# Patient Record
Sex: Male | Born: 1985 | Race: Black or African American | Hispanic: No | Marital: Married | Smoking: Never smoker
Health system: Southern US, Community
[De-identification: ages and names within clinical notes are randomized; demographics above are authoritative.]

## PROBLEM LIST (undated history)

## (undated) DIAGNOSIS — I629 Nontraumatic intracranial hemorrhage, unspecified: Secondary | ICD-10-CM

## (undated) HISTORY — PX: BRAIN SURGERY: SHX531

## (undated) HISTORY — DX: Nontraumatic intracranial hemorrhage, unspecified: I62.9

---

## 2020-08-26 ENCOUNTER — Encounter (HOSPITAL_COMMUNITY): Payer: Self-pay | Admitting: Emergency Medicine

## 2020-08-26 ENCOUNTER — Emergency Department (HOSPITAL_COMMUNITY): Payer: Medicaid Other

## 2020-08-26 ENCOUNTER — Inpatient Hospital Stay (HOSPITAL_COMMUNITY): Payer: Medicaid Other

## 2020-08-26 ENCOUNTER — Inpatient Hospital Stay (HOSPITAL_COMMUNITY)
Admission: EM | Admit: 2020-08-26 | Discharge: 2020-09-12 | DRG: 082 | Disposition: A | Discharge status: Rehabilitation Facility | Payer: Medicaid Other | Attending: General Surgery | Admitting: General Surgery

## 2020-08-26 DIAGNOSIS — R490 Dysphonia: Secondary | ICD-10-CM | POA: Diagnosis present

## 2020-08-26 DIAGNOSIS — E876 Hypokalemia: Secondary | ICD-10-CM | POA: Diagnosis present

## 2020-08-26 DIAGNOSIS — G9349 Other encephalopathy: Secondary | ICD-10-CM | POA: Diagnosis present

## 2020-08-26 DIAGNOSIS — T148XXA Other injury of unspecified body region, initial encounter: Secondary | ICD-10-CM | POA: Diagnosis present

## 2020-08-26 DIAGNOSIS — Z452 Encounter for adjustment and management of vascular access device: Secondary | ICD-10-CM

## 2020-08-26 DIAGNOSIS — Z20822 Contact with and (suspected) exposure to covid-19: Secondary | ICD-10-CM | POA: Diagnosis present

## 2020-08-26 DIAGNOSIS — Z978 Presence of other specified devices: Secondary | ICD-10-CM

## 2020-08-26 DIAGNOSIS — F1290 Cannabis use, unspecified, uncomplicated: Secondary | ICD-10-CM | POA: Diagnosis present

## 2020-08-26 DIAGNOSIS — N179 Acute kidney failure, unspecified: Secondary | ICD-10-CM | POA: Diagnosis present

## 2020-08-26 DIAGNOSIS — R1310 Dysphagia, unspecified: Secondary | ICD-10-CM | POA: Diagnosis present

## 2020-08-26 DIAGNOSIS — S066X9A Traumatic subarachnoid hemorrhage with loss of consciousness of unspecified duration, initial encounter: Principal | ICD-10-CM | POA: Diagnosis present

## 2020-08-26 DIAGNOSIS — S0001XA Abrasion of scalp, initial encounter: Secondary | ICD-10-CM | POA: Diagnosis present

## 2020-08-26 DIAGNOSIS — S061X9A Traumatic cerebral edema with loss of consciousness of unspecified duration, initial encounter: Secondary | ICD-10-CM | POA: Diagnosis present

## 2020-08-26 DIAGNOSIS — R402142 Coma scale, eyes open, spontaneous, at arrival to emergency department: Secondary | ICD-10-CM | POA: Diagnosis present

## 2020-08-26 DIAGNOSIS — R402212 Coma scale, best verbal response, none, at arrival to emergency department: Secondary | ICD-10-CM | POA: Diagnosis present

## 2020-08-26 DIAGNOSIS — Y92410 Unspecified street and highway as the place of occurrence of the external cause: Secondary | ICD-10-CM

## 2020-08-26 DIAGNOSIS — H5461 Unqualified visual loss, right eye, normal vision left eye: Secondary | ICD-10-CM | POA: Diagnosis present

## 2020-08-26 DIAGNOSIS — J9601 Acute respiratory failure with hypoxia: Secondary | ICD-10-CM | POA: Diagnosis present

## 2020-08-26 DIAGNOSIS — Z781 Physical restraint status: Secondary | ICD-10-CM

## 2020-08-26 DIAGNOSIS — R402342 Coma scale, best motor response, flexion withdrawal, at arrival to emergency department: Secondary | ICD-10-CM | POA: Diagnosis present

## 2020-08-26 DIAGNOSIS — S0083XA Contusion of other part of head, initial encounter: Secondary | ICD-10-CM | POA: Diagnosis present

## 2020-08-26 DIAGNOSIS — J1520 Pneumonia due to staphylococcus, unspecified: Secondary | ICD-10-CM | POA: Diagnosis not present

## 2020-08-26 DIAGNOSIS — S065X9A Traumatic subdural hemorrhage with loss of consciousness of unspecified duration, initial encounter: Secondary | ICD-10-CM | POA: Diagnosis present

## 2020-08-26 DIAGNOSIS — G932 Benign intracranial hypertension: Secondary | ICD-10-CM | POA: Diagnosis present

## 2020-08-26 DIAGNOSIS — E871 Hypo-osmolality and hyponatremia: Secondary | ICD-10-CM | POA: Diagnosis present

## 2020-08-26 DIAGNOSIS — R569 Unspecified convulsions: Secondary | ICD-10-CM | POA: Diagnosis present

## 2020-08-26 DIAGNOSIS — T1490XA Injury, unspecified, initial encounter: Secondary | ICD-10-CM

## 2020-08-26 DIAGNOSIS — J302 Other seasonal allergic rhinitis: Secondary | ICD-10-CM | POA: Diagnosis present

## 2020-08-26 DIAGNOSIS — S066X0D Traumatic subarachnoid hemorrhage without loss of consciousness, subsequent encounter: Secondary | ICD-10-CM | POA: Diagnosis not present

## 2020-08-26 DIAGNOSIS — I609 Nontraumatic subarachnoid hemorrhage, unspecified: Secondary | ICD-10-CM

## 2020-08-26 DIAGNOSIS — J9690 Respiratory failure, unspecified, unspecified whether with hypoxia or hypercapnia: Secondary | ICD-10-CM

## 2020-08-26 DIAGNOSIS — R4182 Altered mental status, unspecified: Secondary | ICD-10-CM | POA: Diagnosis present

## 2020-08-26 DIAGNOSIS — S60519A Abrasion of unspecified hand, initial encounter: Secondary | ICD-10-CM

## 2020-08-26 LAB — COMPREHENSIVE METABOLIC PANEL
ALT: 26 U/L (ref 0–44)
ALT: 27 U/L (ref 0–44)
AST: 38 U/L (ref 15–41)
AST: 56 U/L — ABNORMAL HIGH (ref 15–41)
Albumin: 4.3 g/dL (ref 3.5–5.0)
Albumin: 4.3 g/dL (ref 3.5–5.0)
Alkaline Phosphatase: 45 U/L (ref 38–126)
Alkaline Phosphatase: 48 U/L (ref 38–126)
Anion gap: 14 (ref 5–15)
Anion gap: 6 (ref 5–15)
BUN: 16 mg/dL (ref 6–20)
BUN: 11 mg/dL (ref 6–20)
CO2: 20 mmol/L — ABNORMAL LOW (ref 22–32)
CO2: 25 mmol/L (ref 22–32)
Calcium: 9.3 mg/dL (ref 8.9–10.3)
Calcium: 8.8 mg/dL — ABNORMAL LOW (ref 8.9–10.3)
Chloride: 102 mmol/L (ref 98–111)
Chloride: 104 mmol/L (ref 98–111)
Creatinine, Ser: 1.1 mg/dL (ref 0.61–1.24)
Creatinine, Ser: 1.1 mg/dL (ref 0.61–1.24)
GFR, Estimated: 45 mL/min — ABNORMAL LOW (ref >60)
GFR, Estimated: >60 mL/min (ref >60)
Glucose, Bld: 150 mg/dL — ABNORMAL HIGH (ref 70–99)
Glucose, Bld: 118 mg/dL — ABNORMAL HIGH (ref 70–99)
Potassium: 2.9 mmol/L — ABNORMAL LOW (ref 3.5–5.1)
Potassium: 3.6 mmol/L (ref 3.5–5.1)
Sodium: 136 mmol/L (ref 135–145)
Sodium: 135 mmol/L (ref 135–145)
Total Bilirubin: 0.8 mg/dL (ref 0.3–1.2)
Total Bilirubin: 1.1 mg/dL (ref 0.3–1.2)
Total Protein: 6.7 g/dL (ref 6.5–8.1)
Total Protein: 6.5 g/dL (ref 6.5–8.1)

## 2020-08-26 LAB — GLUCOSE, CAPILLARY: Glucose-Capillary: 99 mg/dL (ref 70–99)

## 2020-08-26 LAB — CBC
HCT: 45.4 % (ref 39.0–52.0)
Hemoglobin: 15.6 g/dL (ref 13.0–17.0)
MCH: 31 pg (ref 26.0–34.0)
MCHC: 34.4 g/dL (ref 30.0–36.0)
MCV: 90.3 fL (ref 80.0–100.0)
Platelets: 187 10*3/uL (ref 150–400)
RBC: 5.03 MIL/uL (ref 4.22–5.81)
RDW: 11.7 % (ref 11.5–15.5)
WBC: 7.9 10*3/uL (ref 4.0–10.5)
nRBC: 0 % (ref 0.0–0.2)

## 2020-08-26 LAB — I-STAT CHEM 8, ED
BUN: 17 mg/dL (ref 6–20)
Calcium, Ion: 0.94 mmol/L — ABNORMAL LOW (ref 1.15–1.40)
Chloride: 105 mmol/L (ref 98–111)
Creatinine, Ser: 0.9 mg/dL (ref 0.61–1.24)
Glucose, Bld: 148 mg/dL — ABNORMAL HIGH (ref 70–99)
HCT: 46 % (ref 39.0–52.0)
Hemoglobin: 15.6 g/dL (ref 13.0–17.0)
Potassium: 2.8 mmol/L — ABNORMAL LOW (ref 3.5–5.1)
Sodium: 137 mmol/L (ref 135–145)
TCO2: 20 mmol/L — ABNORMAL LOW (ref 22–32)

## 2020-08-26 LAB — SAMPLE TO BLOOD BANK

## 2020-08-26 LAB — RESP PANEL BY RT-PCR (FLU A&B, COVID) ARPGX2
Influenza A by PCR: NEGATIVE
Influenza B by PCR: NEGATIVE
SARS Coronavirus 2 by RT PCR: NEGATIVE

## 2020-08-26 LAB — URINALYSIS, ROUTINE W REFLEX MICROSCOPIC
Bacteria, UA: NONE SEEN
Bilirubin Urine: NEGATIVE
Glucose, UA: NEGATIVE mg/dL
Ketones, ur: NEGATIVE mg/dL
Leukocytes,Ua: NEGATIVE
Nitrite: NEGATIVE
Protein, ur: NEGATIVE mg/dL
Specific Gravity, Urine: 1.033 — ABNORMAL HIGH (ref 1.005–1.030)
pH: 7 (ref 5.0–8.0)

## 2020-08-26 LAB — LACTIC ACID, PLASMA: Lactic Acid, Venous: 3.4 mmol/L (ref 0.5–1.9)

## 2020-08-26 LAB — I-STAT ARTERIAL BLOOD GAS, ED
Acid-Base Excess: 1 mmol/L (ref 0.0–2.0)
Bicarbonate: 26.5 mmol/L (ref 20.0–28.0)
Calcium, Ion: 1.15 mmol/L (ref 1.15–1.40)
HCT: 43 % (ref 39.0–52.0)
Hemoglobin: 14.6 g/dL (ref 13.0–17.0)
O2 Saturation: 100 %
Potassium: 3.7 mmol/L (ref 3.5–5.1)
Sodium: 137 mmol/L (ref 135–145)
TCO2: 28 mmol/L (ref 22–32)
pCO2 arterial: 42.9 mmHg (ref 32.0–48.0)
pH, Arterial: 7.399 (ref 7.350–7.450)
pO2, Arterial: 580 mmHg — ABNORMAL HIGH (ref 83.0–108.0)

## 2020-08-26 LAB — PROTIME-INR
INR: 1.2 (ref 0.8–1.2)
Prothrombin Time: 14.4 s (ref 11.4–15.2)

## 2020-08-26 LAB — MRSA PCR SCREENING: MRSA by PCR: NEGATIVE

## 2020-08-26 LAB — ETHANOL: Alcohol, Ethyl (B): <10 mg/dL (ref <10)

## 2020-08-26 LAB — MAGNESIUM: Magnesium: 2.1 mg/dL (ref 1.7–2.4)

## 2020-08-26 LAB — RAPID URINE DRUG SCREEN, HOSP PERFORMED
Amphetamines: NOT DETECTED
Barbiturates: NOT DETECTED
Benzodiazepines: POSITIVE — AB
Cocaine: NOT DETECTED
Opiates: NOT DETECTED
Tetrahydrocannabinol: POSITIVE — AB

## 2020-08-26 MED ORDER — DOCUSATE SODIUM 50 MG/5ML PO LIQD
100.0000 mg | Freq: Two times a day (BID) | ORAL | Status: DC
  Filled 2020-08-26 (×2): qty 10

## 2020-08-26 MED ORDER — LEVETIRACETAM IN NACL 500 MG/100ML IV SOLN
500.0000 mg | Freq: Two times a day (BID) | INTRAVENOUS | Status: AC
Start: 2020-08-26 — End: 2020-09-02
  Administered 2020-08-26 – 2020-09-02 (×14): 500 mg via INTRAVENOUS
  Filled 2020-08-26 (×15): qty 100

## 2020-08-26 MED ORDER — SUCCINYLCHOLINE CHLORIDE 20 MG/ML IJ SOLN
INTRAMUSCULAR | Status: AC | PRN
  Administered 2020-08-26: 50 mg via INTRAVENOUS

## 2020-08-26 MED ORDER — MIDAZOLAM HCL 2 MG/2ML IJ SOLN
2.0000 mg | INTRAMUSCULAR | Status: DC | PRN
  Administered 2020-09-01: 2 mg via INTRAVENOUS
  Filled 2020-08-26: qty 2

## 2020-08-26 MED ORDER — MIDAZOLAM HCL 5 MG/5ML IJ SOLN
INTRAMUSCULAR | Status: AC | PRN
  Administered 2020-08-26: 2 mg via INTRAVENOUS

## 2020-08-26 MED ORDER — FENTANYL 2500MCG IN NS 250ML (10MCG/ML) PREMIX INFUSION
50.0000 ug/h | INTRAVENOUS | Status: DC
  Administered 2020-08-26: 200 ug/h via INTRAVENOUS
  Administered 2020-08-26: 250 ug/h via INTRAVENOUS
  Administered 2020-08-27: 100 ug/h via INTRAVENOUS
  Administered 2020-08-28: 75 ug/h via INTRAVENOUS
  Administered 2020-08-29: 150 ug/h via INTRAVENOUS
  Administered 2020-08-29: 325 ug/h via INTRAVENOUS
  Administered 2020-08-30: 300 ug/h via INTRAVENOUS
  Administered 2020-08-30: 250 ug/h via INTRAVENOUS
  Administered 2020-08-30 – 2020-08-31 (×2): 300 ug/h via INTRAVENOUS
  Filled 2020-08-26 (×10): qty 250

## 2020-08-26 MED ORDER — DOCUSATE SODIUM 50 MG/5ML PO LIQD
100.0000 mg | Freq: Two times a day (BID) | ORAL | Status: DC
  Administered 2020-08-26 – 2020-09-01 (×11): 100 mg
  Filled 2020-08-26 (×14): qty 10

## 2020-08-26 MED ORDER — ACETAMINOPHEN 160 MG/5ML PO SOLN: 650.0000 mg | ORAL | Status: DC | PRN

## 2020-08-26 MED ORDER — SUCCINYLCHOLINE CHLORIDE 20 MG/ML IJ SOLN
INTRAMUSCULAR | Status: AC | PRN
  Administered 2020-08-26: 120 mg via INTRAVENOUS

## 2020-08-26 MED ORDER — PROPOFOL 1000 MG/100ML IV EMUL
0.0000 ug/kg/min | INTRAVENOUS | Status: DC
  Administered 2020-08-26: 50 ug/kg/min via INTRAVENOUS

## 2020-08-26 MED ORDER — ONDANSETRON 4 MG PO TBDP: 4.0000 mg | ORAL_TABLET | Freq: Four times a day (QID) | ORAL | Status: DC | PRN

## 2020-08-26 MED ORDER — MIDAZOLAM HCL 2 MG/2ML IJ SOLN
INTRAMUSCULAR | Status: AC
  Filled 2020-08-26: qty 2

## 2020-08-26 MED ORDER — ONDANSETRON HCL 4 MG/2ML IJ SOLN: 4.0000 mg | Freq: Four times a day (QID) | INTRAMUSCULAR | Status: DC | PRN

## 2020-08-26 MED ORDER — PANTOPRAZOLE SODIUM 40 MG IV SOLR
40.0000 mg | Freq: Every day | INTRAVENOUS | Status: DC
  Administered 2020-08-26 – 2020-08-28 (×3): 40 mg via INTRAVENOUS
  Filled 2020-08-26 (×3): qty 40

## 2020-08-26 MED ORDER — PANTOPRAZOLE SODIUM 40 MG PO TBEC: 40.0000 mg | DELAYED_RELEASE_TABLET | Freq: Every day | ORAL | Status: DC

## 2020-08-26 MED ORDER — POTASSIUM CHLORIDE 10 MEQ/100ML IV SOLN: 10.0000 meq | INTRAVENOUS | Status: DC

## 2020-08-26 MED ORDER — LORAZEPAM 2 MG/ML IJ SOLN
INTRAMUSCULAR | Status: AC | PRN
  Administered 2020-08-26: 2 mg via INTRAVENOUS

## 2020-08-26 MED ORDER — LORAZEPAM 2 MG/ML IJ SOLN
2.0000 mg | Freq: Once | INTRAMUSCULAR | Status: AC
  Administered 2020-08-26: 2 mg via INTRAVENOUS
  Filled 2020-08-26: qty 1

## 2020-08-26 MED ORDER — ACETAMINOPHEN 325 MG PO TABS
650.0000 mg | ORAL_TABLET | ORAL | Status: DC | PRN
  Administered 2020-08-26: 650 mg via ORAL
  Filled 2020-08-26: qty 2

## 2020-08-26 MED ORDER — POTASSIUM CHLORIDE 10 MEQ/100ML IV SOLN
10.0000 meq | INTRAVENOUS | Status: AC
Start: 2020-08-26 — End: 2020-08-26
  Administered 2020-08-26 (×4): 10 meq via INTRAVENOUS
  Filled 2020-08-26 (×4): qty 100

## 2020-08-26 MED ORDER — IOHEXOL 300 MG/ML  SOLN
100.0000 mL | Freq: Once | INTRAMUSCULAR | Status: AC | PRN
  Administered 2020-08-26: 100 mL via INTRAVENOUS

## 2020-08-26 MED ORDER — MIDAZOLAM HCL 2 MG/2ML IJ SOLN: 2.0000 mg | INTRAMUSCULAR | Status: DC | PRN

## 2020-08-26 MED ORDER — PROPOFOL 1000 MG/100ML IV EMUL
INTRAVENOUS | Status: AC
  Administered 2020-08-26: 75 ug/kg/min via INTRAVENOUS
  Filled 2020-08-26: qty 100

## 2020-08-26 MED ORDER — LEVETIRACETAM IN NACL 1000 MG/100ML IV SOLN
1000.0000 mg | Freq: Once | INTRAVENOUS | Status: AC
  Administered 2020-08-26: 1000 mg via INTRAVENOUS

## 2020-08-26 MED ORDER — POTASSIUM CHLORIDE IN NACL 20-0.9 MEQ/L-% IV SOLN
INTRAVENOUS | Status: DC
  Filled 2020-08-26 (×5): qty 1000

## 2020-08-26 MED ORDER — SODIUM CHLORIDE 0.9 % IV SOLN
INTRAVENOUS | Status: AC | PRN
  Administered 2020-08-26: 100 mL/h via INTRAVENOUS

## 2020-08-26 MED ORDER — LORAZEPAM 2 MG/ML IJ SOLN
INTRAMUSCULAR | Status: AC
  Filled 2020-08-26: qty 1

## 2020-08-26 MED ORDER — ETOMIDATE 2 MG/ML IV SOLN
INTRAVENOUS | Status: AC | PRN
  Administered 2020-08-26: 20 mg via INTRAVENOUS

## 2020-08-26 MED ORDER — METOPROLOL TARTRATE 5 MG/5ML IV SOLN
5.0000 mg | Freq: Four times a day (QID) | INTRAVENOUS | Status: DC | PRN
  Administered 2020-08-28 – 2020-09-01 (×3): 5 mg via INTRAVENOUS
  Filled 2020-08-26 (×3): qty 5

## 2020-08-26 MED ORDER — FENTANYL BOLUS VIA INFUSION
50.0000 ug | INTRAVENOUS | Status: DC | PRN
Start: 2020-08-26 — End: 2020-09-06
  Administered 2020-08-27 – 2020-08-28 (×4): 50 ug via INTRAVENOUS
  Administered 2020-09-01 – 2020-09-02 (×4): 100 ug via INTRAVENOUS
  Administered 2020-09-03: 50 ug via INTRAVENOUS
  Administered 2020-09-03 (×2): 100 ug via INTRAVENOUS
  Administered 2020-09-03: 50 ug via INTRAVENOUS
  Administered 2020-09-04: 100 ug via INTRAVENOUS
  Administered 2020-09-04: 75 ug via INTRAVENOUS
  Filled 2020-08-26: qty 100

## 2020-08-26 MED ORDER — CLONAZEPAM 1 MG PO TABS: 1.0000 mg | ORAL_TABLET | Freq: Two times a day (BID) | ORAL | Status: DC | PRN

## 2020-08-26 MED ORDER — SODIUM CHLORIDE 0.9 % IV SOLN
INTRAVENOUS | Status: AC | PRN
  Administered 2020-08-26: 999 mL/h via INTRAVENOUS

## 2020-08-26 MED ORDER — PROPOFOL 1000 MG/100ML IV EMUL
0.0000 ug/kg/min | INTRAVENOUS | Status: AC
  Administered 2020-08-26: 50 ug/kg/min via INTRAVENOUS
  Administered 2020-08-27 (×2): 20 ug/kg/min via INTRAVENOUS
  Administered 2020-08-28 (×3): 60 ug/kg/min via INTRAVENOUS
  Administered 2020-08-28 (×2): 30 ug/kg/min via INTRAVENOUS
  Administered 2020-08-29 (×2): 80 ug/kg/min via INTRAVENOUS
  Administered 2020-08-29: 70 ug/kg/min via INTRAVENOUS
  Administered 2020-08-29: 60 ug/kg/min via INTRAVENOUS
  Administered 2020-08-29: 70 ug/kg/min via INTRAVENOUS
  Administered 2020-08-29: 80 ug/kg/min via INTRAVENOUS
  Administered 2020-08-30 (×2): 70 ug/kg/min via INTRAVENOUS
  Administered 2020-08-30: 68.352 ug/kg/min via INTRAVENOUS
  Administered 2020-08-30 (×3): 70 ug/kg/min via INTRAVENOUS
  Administered 2020-08-30: 69.991 ug/kg/min via INTRAVENOUS
  Administered 2020-08-31: 70 ug/kg/min via INTRAVENOUS
  Administered 2020-08-31: 80 ug/kg/min via INTRAVENOUS
  Administered 2020-08-31 (×4): 70 ug/kg/min via INTRAVENOUS
  Administered 2020-08-31 – 2020-09-01 (×8): 80 ug/kg/min via INTRAVENOUS
  Filled 2020-08-26 (×4): qty 100
  Filled 2020-08-26: qty 200
  Filled 2020-08-26 (×4): qty 100
  Filled 2020-08-26 (×2): qty 200
  Filled 2020-08-26 (×3): qty 100
  Filled 2020-08-26: qty 200
  Filled 2020-08-26 (×3): qty 100
  Filled 2020-08-26: qty 200
  Filled 2020-08-26 (×4): qty 100
  Filled 2020-08-26: qty 200
  Filled 2020-08-26: qty 100
  Filled 2020-08-26: qty 200

## 2020-08-26 MED ORDER — POLYETHYLENE GLYCOL 3350 17 G PO PACK: 17.0000 g | PACK | Freq: Every day | ORAL | Status: DC
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
## 2020-08-26 NOTE — ED Notes (Signed)
Neuro surgery at bedside.
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
## 2020-08-26 NOTE — Progress Notes (Signed)
STAT EEG complete - results pending. ? ?

## 2020-08-26 NOTE — TOC CAGE-AID Note (Signed)
Transition of Care [facility]) - CAGE-AID Screening   Patient Details  Name: CAIRO AGOSTINELLI MRN: [id number] Date of Birth: 03/29/1986   Clinical Narrative:  Patient currently intubated and sedated, cannot complete at this time.  CAGE-AID Screening: Substance Abuse Screening unable to be completed due to: : Patient unable to participate
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
## 2020-08-26 NOTE — Progress Notes (Signed)
I was notified by bedside RN that patient's sedation had been off for close to an hour, and patient did not wake up but is withdrawing to painful stimuli in all 4 extremities. He has been persistently febrile but hemodynamically stable. Head CT was repeated and shows some progression of frontal contusions (as expected per neurosurgery note), particularly on the right, but no midline shift or hydrocephalus. RN reports that patient had more movement/agitation while being moved to the scanner and required sedation during the scan. Will continue holding sedation for now, can resume if patient becomes uncomfortable or agitated. Continue neuro checks. Continue serial head CTs until contusions stabilize.  Sophronia Simas, MD 08/26/20 11:12 PM

## 2020-08-26 NOTE — Progress Notes (Signed)
Pt taken to CT on the vent and back to his room 4N32 without any complications.

## 2020-08-26 NOTE — Progress Notes (Signed)
RT NOTE: RT transported patient on ventilator from ED to room 4N32 with no complications. Vitals are stable. RT will continue to monitor.

## 2020-08-26 NOTE — ED Notes (Signed)
PT to CT.  Delayed d/t pt being combative and attempting to dislodge tube despite sedation.
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
## 2020-08-26 NOTE — Progress Notes (Signed)
Bedelia Person, MD, at bedside and removed C-collar from patient.
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
## 2020-08-26 NOTE — ED Notes (Signed)
Trauma End 

## 2020-08-27 LAB — TRIGLYCERIDES: Triglycerides: 39 mg/dL (ref <150)

## 2020-08-27 LAB — CBC
HCT: 47.1 % (ref 39.0–52.0)
Hemoglobin: 15.9 g/dL (ref 13.0–17.0)
MCH: 30.9 pg (ref 26.0–34.0)
MCHC: 33.8 g/dL (ref 30.0–36.0)
MCV: 91.6 fL (ref 80.0–100.0)
Platelets: 177 10*3/uL (ref 150–400)
RBC: 5.14 MIL/uL (ref 4.22–5.81)
RDW: 11.8 % (ref 11.5–15.5)
WBC: 13.5 10*3/uL — ABNORMAL HIGH (ref 4.0–10.5)
nRBC: 0 % (ref 0.0–0.2)

## 2020-08-27 LAB — COMPREHENSIVE METABOLIC PANEL
ALT: 30 U/L (ref 0–44)
AST: 64 U/L — ABNORMAL HIGH (ref 15–41)
Albumin: 4.5 g/dL (ref 3.5–5.0)
Alkaline Phosphatase: 53 U/L (ref 38–126)
Anion gap: 12 (ref 5–15)
BUN: 9 mg/dL (ref 6–20)
CO2: 19 mmol/L — ABNORMAL LOW (ref 22–32)
Calcium: 9.1 mg/dL (ref 8.9–10.3)
Chloride: 102 mmol/L (ref 98–111)
Creatinine, Ser: 1.08 mg/dL (ref 0.61–1.24)
GFR, Estimated: >60 mL/min (ref >60)
Glucose, Bld: 125 mg/dL — ABNORMAL HIGH (ref 70–99)
Potassium: 4.1 mmol/L (ref 3.5–5.1)
Sodium: 133 mmol/L — ABNORMAL LOW (ref 135–145)
Total Bilirubin: 1.3 mg/dL — ABNORMAL HIGH (ref 0.3–1.2)
Total Protein: 7.4 g/dL (ref 6.5–8.1)

## 2020-08-27 LAB — SODIUM: Sodium: 130 mmol/L — ABNORMAL LOW (ref 135–145)

## 2020-08-27 MED ORDER — CHLORHEXIDINE GLUCONATE 0.12% ORAL RINSE (MEDLINE KIT)
15.0000 mL | Freq: Two times a day (BID) | OROMUCOSAL | Status: DC
  Administered 2020-08-27 – 2020-09-12 (×31): 15 mL via OROMUCOSAL

## 2020-08-27 MED ORDER — SODIUM CHLORIDE 3 % IV BOLUS
100.0000 mL | Freq: Once | INTRAVENOUS | Status: AC
  Administered 2020-08-27: 100 mL via INTRAVENOUS
  Filled 2020-08-27: qty 500

## 2020-08-27 MED ORDER — ACETAMINOPHEN 160 MG/5ML PO SOLN
650.0000 mg | Freq: Four times a day (QID) | ORAL | Status: DC
  Administered 2020-08-27 – 2020-08-28 (×3): 650 mg via ORAL
  Filled 2020-08-27 (×4): qty 20.3

## 2020-08-27 MED ORDER — ORAL CARE MOUTH RINSE
15.0000 mL | OROMUCOSAL | Status: DC
  Administered 2020-08-27 – 2020-09-09 (×125): 15 mL via OROMUCOSAL

## 2020-08-27 MED ORDER — ACETAMINOPHEN 160 MG/5ML PO SOLN
650.0000 mg | ORAL | Status: DC | PRN
  Administered 2020-08-27: 650 mg
  Filled 2020-08-27: qty 20.3

## 2020-08-27 MED ORDER — SODIUM CHLORIDE 3 % IV SOLN
INTRAVENOUS | Status: DC
  Filled 2020-08-27: qty 500

## 2020-08-27 MED ORDER — CHLORHEXIDINE GLUCONATE CLOTH 2 % EX PADS
6.0000 | MEDICATED_PAD | Freq: Every day | CUTANEOUS | Status: DC
  Administered 2020-08-27 – 2020-09-12 (×16): 6 via TOPICAL
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
## 2020-08-28 ENCOUNTER — Inpatient Hospital Stay (HOSPITAL_COMMUNITY): Payer: Medicaid Other

## 2020-08-28 LAB — BASIC METABOLIC PANEL
Anion gap: 7 (ref 5–15)
BUN: 10 mg/dL (ref 6–20)
CO2: 24 mmol/L (ref 22–32)
Calcium: 8.7 mg/dL — ABNORMAL LOW (ref 8.9–10.3)
Chloride: 107 mmol/L (ref 98–111)
Creatinine, Ser: 0.91 mg/dL (ref 0.61–1.24)
GFR, Estimated: >60 mL/min (ref >60)
Glucose, Bld: 108 mg/dL — ABNORMAL HIGH (ref 70–99)
Potassium: 4.3 mmol/L (ref 3.5–5.1)
Sodium: 138 mmol/L (ref 135–145)

## 2020-08-28 LAB — GLUCOSE, CAPILLARY
Glucose-Capillary: 88 mg/dL (ref 70–99)
Glucose-Capillary: 91 mg/dL (ref 70–99)
Glucose-Capillary: 99 mg/dL (ref 70–99)

## 2020-08-28 LAB — CBC
HCT: 40.7 % (ref 39.0–52.0)
Hemoglobin: 14 g/dL (ref 13.0–17.0)
MCH: 31.2 pg (ref 26.0–34.0)
MCHC: 34.4 g/dL (ref 30.0–36.0)
MCV: 90.6 fL (ref 80.0–100.0)
Platelets: 135 10*3/uL — ABNORMAL LOW (ref 150–400)
RBC: 4.49 MIL/uL (ref 4.22–5.81)
RDW: 11.8 % (ref 11.5–15.5)
WBC: 11.6 10*3/uL — ABNORMAL HIGH (ref 4.0–10.5)
nRBC: 0 % (ref 0.0–0.2)

## 2020-08-28 LAB — SODIUM
Sodium: 138 mmol/L (ref 135–145)
Sodium: 139 mmol/L (ref 135–145)
Sodium: 141 mmol/L (ref 135–145)

## 2020-08-28 LAB — MAGNESIUM
Magnesium: 2.3 mg/dL (ref 1.7–2.4)
Magnesium: 2.1 mg/dL (ref 1.7–2.4)

## 2020-08-28 LAB — PHOSPHORUS
Phosphorus: 2.4 mg/dL — ABNORMAL LOW (ref 2.5–4.6)
Phosphorus: 1.6 mg/dL — ABNORMAL LOW (ref 2.5–4.6)

## 2020-08-28 MED ORDER — ROCURONIUM BROMIDE 50 MG/5ML IV SOLN
70.0000 mg | Freq: Once | INTRAVENOUS | Status: AC
  Filled 2020-08-28: qty 7

## 2020-08-28 MED ORDER — ACETAMINOPHEN 160 MG/5ML PO SOLN
650.0000 mg | Freq: Four times a day (QID) | ORAL | Status: DC
  Administered 2020-08-28 – 2020-09-07 (×41): 650 mg
  Filled 2020-08-28 (×40): qty 20.3

## 2020-08-28 MED ORDER — SODIUM CHLORIDE 3 % IV BOLUS
125.0000 mL | Freq: Once | INTRAVENOUS | Status: AC
  Administered 2020-08-28: 125 mL via INTRAVENOUS
  Filled 2020-08-28: qty 500

## 2020-08-28 MED ORDER — ROCURONIUM BROMIDE 10 MG/ML (PF) SYRINGE
PREFILLED_SYRINGE | INTRAVENOUS | Status: AC
  Administered 2020-08-28: 70 mg
  Filled 2020-08-28: qty 10

## 2020-08-28 MED ORDER — SODIUM CHLORIDE 3 % IV SOLN
INTRAVENOUS | Status: DC
  Administered 2020-08-30: 75 mL/h via INTRAVENOUS
  Filled 2020-08-28 (×16): qty 500

## 2020-08-28 MED ORDER — POTASSIUM & SODIUM PHOSPHATES 280-160-250 MG PO PACK
2.0000 | PACK | Freq: Once | ORAL | Status: AC
  Administered 2020-08-28: 2
  Filled 2020-08-28: qty 2

## 2020-08-28 MED ORDER — SODIUM CHLORIDE 0.9 % IV SOLN: INTRAVENOUS | Status: DC

## 2020-08-28 MED ORDER — PROSOURCE TF PO LIQD
45.0000 mL | Freq: Two times a day (BID) | ORAL | Status: DC
  Administered 2020-08-28 – 2020-08-29 (×3): 45 mL
  Filled 2020-08-28 (×3): qty 45

## 2020-08-28 MED ORDER — VITAL HIGH PROTEIN PO LIQD
1000.0000 mL | ORAL | Status: DC
  Administered 2020-08-28: 1000 mL
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
## 2020-08-28 NOTE — Progress Notes (Signed)
Transport pt to CT and back to room. No noted respiratory issues at this time.
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
## 2020-08-29 LAB — BASIC METABOLIC PANEL
Anion gap: 5 (ref 5–15)
BUN: 14 mg/dL (ref 6–20)
CO2: 23 mmol/L (ref 22–32)
Calcium: 8 mg/dL — ABNORMAL LOW (ref 8.9–10.3)
Chloride: 116 mmol/L — ABNORMAL HIGH (ref 98–111)
Creatinine, Ser: 0.78 mg/dL (ref 0.61–1.24)
GFR, Estimated: >60 mL/min (ref >60)
Glucose, Bld: 124 mg/dL — ABNORMAL HIGH (ref 70–99)
Potassium: 3.4 mmol/L — ABNORMAL LOW (ref 3.5–5.1)
Sodium: 144 mmol/L (ref 135–145)

## 2020-08-29 LAB — CBC
HCT: 32.8 % — ABNORMAL LOW (ref 39.0–52.0)
Hemoglobin: 11.5 g/dL — ABNORMAL LOW (ref 13.0–17.0)
MCH: 32.2 pg (ref 26.0–34.0)
MCHC: 35.1 g/dL (ref 30.0–36.0)
MCV: 91.9 fL (ref 80.0–100.0)
Platelets: 125 10*3/uL — ABNORMAL LOW (ref 150–400)
RBC: 3.57 MIL/uL — ABNORMAL LOW (ref 4.22–5.81)
RDW: 12 % (ref 11.5–15.5)
WBC: 9.3 10*3/uL (ref 4.0–10.5)
nRBC: 0 % (ref 0.0–0.2)

## 2020-08-29 LAB — POCT I-STAT 7, (LYTES, BLD GAS, ICA,H+H)
Acid-base deficit: 3 mmol/L — ABNORMAL HIGH (ref 0.0–2.0)
Bicarbonate: 22.4 mmol/L (ref 20.0–28.0)
Calcium, Ion: 1.17 mmol/L (ref 1.15–1.40)
HCT: 32 % — ABNORMAL LOW (ref 39.0–52.0)
Hemoglobin: 10.9 g/dL — ABNORMAL LOW (ref 13.0–17.0)
O2 Saturation: 91 %
Patient temperature: 99.7
Potassium: 4.1 mmol/L (ref 3.5–5.1)
Sodium: 144 mmol/L (ref 135–145)
TCO2: 24 mmol/L (ref 22–32)
pCO2 arterial: 42.2 mmHg (ref 32.0–48.0)
pH, Arterial: 7.336 — ABNORMAL LOW (ref 7.350–7.450)
pO2, Arterial: 66 mmHg — ABNORMAL LOW (ref 83.0–108.0)

## 2020-08-29 LAB — GLUCOSE, CAPILLARY
Glucose-Capillary: 105 mg/dL — ABNORMAL HIGH (ref 70–99)
Glucose-Capillary: 85 mg/dL (ref 70–99)
Glucose-Capillary: 105 mg/dL — ABNORMAL HIGH (ref 70–99)
Glucose-Capillary: 90 mg/dL (ref 70–99)
Glucose-Capillary: 89 mg/dL (ref 70–99)

## 2020-08-29 LAB — PHOSPHORUS
Phosphorus: 1.4 mg/dL — ABNORMAL LOW (ref 2.5–4.6)
Phosphorus: 2.2 mg/dL — ABNORMAL LOW (ref 2.5–4.6)

## 2020-08-29 LAB — MAGNESIUM
Magnesium: 2.2 mg/dL (ref 1.7–2.4)
Magnesium: 2.3 mg/dL (ref 1.7–2.4)

## 2020-08-29 LAB — SODIUM
Sodium: 143 mmol/L (ref 135–145)
Sodium: 144 mmol/L (ref 135–145)
Sodium: 146 mmol/L — ABNORMAL HIGH (ref 135–145)

## 2020-08-29 MED ORDER — ARTIFICIAL TEARS OPHTHALMIC OINT
1.0000 "application " | TOPICAL_OINTMENT | Freq: Three times a day (TID) | OPHTHALMIC | Status: DC
  Administered 2020-08-29 – 2020-09-03 (×13): 1 via OPHTHALMIC
  Filled 2020-08-29: qty 3.5

## 2020-08-29 MED ORDER — PROSOURCE TF PO LIQD
45.0000 mL | Freq: Four times a day (QID) | ORAL | Status: DC
  Administered 2020-08-29 – 2020-09-05 (×28): 45 mL
  Filled 2020-08-29 (×22): qty 45

## 2020-08-29 MED ORDER — PANTOPRAZOLE SODIUM 40 MG PO PACK
40.0000 mg | PACK | Freq: Every day | ORAL | Status: DC
  Administered 2020-08-29 – 2020-09-06 (×9): 40 mg
  Filled 2020-08-29 (×9): qty 20

## 2020-08-29 MED ORDER — VECURONIUM BOLUS VIA INFUSION
10.0000 mg | Freq: Once | INTRAVENOUS | Status: AC
Start: 2020-08-29 — End: 2020-08-29
  Administered 2020-08-29: 10 mg via INTRAVENOUS
  Filled 2020-08-29: qty 10

## 2020-08-29 MED ORDER — HEPARIN SODIUM (PORCINE) 5000 UNIT/ML IJ SOLN
5000.0000 [IU] | Freq: Three times a day (TID) | INTRAMUSCULAR | Status: DC
  Administered 2020-08-29 – 2020-09-12 (×43): 5000 [IU] via SUBCUTANEOUS
  Filled 2020-08-29 (×43): qty 1

## 2020-08-29 MED ORDER — POTASSIUM CHLORIDE 20 MEQ PO PACK
20.0000 meq | PACK | Freq: Two times a day (BID) | ORAL | Status: AC
  Administered 2020-08-29 (×2): 20 meq
  Filled 2020-08-29 (×2): qty 1

## 2020-08-29 MED ORDER — VECURONIUM BROMIDE 10 MG IV SOLR
0.0000 ug/kg/min | Status: DC
  Administered 2020-08-29: 1 ug/kg/min via INTRAVENOUS
  Administered 2020-08-30: 1.101 ug/kg/min via INTRAVENOUS
  Administered 2020-08-31: 1.1 ug/kg/min via INTRAVENOUS
  Administered 2020-09-01 (×2): 1.2 ug/kg/min via INTRAVENOUS
  Filled 2020-08-29 (×9): qty 100

## 2020-08-29 MED ORDER — PIVOT 1.5 CAL PO LIQD
1000.0000 mL | ORAL | Status: DC
  Administered 2020-08-29 – 2020-09-05 (×4): 1000 mL
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
## 2020-08-29 NOTE — Procedures (Signed)
Cortrak  Person Inserting Tube:  Allen [name], RD Tube Type:  Cortrak - 43 inches Tube Location:  Left nare Initial Placement:  Stomach Secured by: Bridle Technique Used to Measure Tube Placement:  Documented cm marking at nare/ corner of mouth Cortrak Secured At:  65 cm    Cortrak Tube Team Note:  Consult received to place a Cortrak feeding tube.   No x-ray is required. RN may begin using tube.    If the tube becomes dislodged please keep the tube and contact the Cortrak team at [email or web address] ([email or web address]) for replacement.  If after hours and replacement cannot be delayed, place a NG tube and confirm placement with an abdominal x-ray.    Allen Niess, MS, RD, LDN RD pager number and weekend/on-call pager number located in Amion.   

## 2020-08-29 NOTE — Progress Notes (Signed)
Subjective: NAEs o/n  Objective: Vital signs in last 24 hours: Temp:  [98.78 F (37.1 C)-100.94 F (38.3 C)] 99.7 F (37.6 C) (04/11 1040) Pulse Rate:  [76-113] 98 (04/11 1200) Resp:  [14-25] 22 (04/11 1200) BP: (99-139)/(55-81) 126/75 (04/11 1130) SpO2:  [95 %-100 %] 97 % (04/11 1200) FiO2 (%):  [30 %] 30 % (04/11 1117) Weight:  [71.6 kg] 71.6 kg (04/11 0500)  Intake/Output from previous day: 04/10 0701 - 04/11 0700 In: 3777.5 [I.V.:2813.1; NG/GT:639.3; IV Piggyback:325.1] Out: 1815 [Urine:1660; Drains:155] Intake/Output this shift: Total I/O In: 481.1 [I.V.:361.1; NG/GT:120] Out: 495 [Urine:420; Drains:75]  Intubated, sedated on propofol and Fentanyl - weak withdrawal to stim - pupils 1-2 mm, sluggish  Lab Results: Recent Labs    08/28/20 0617 08/29/20 0548 08/29/20 1124  WBC 11.6* 9.3  --   HGB 14.0 11.5* 10.9*  HCT 40.7 32.8* 32.0*  PLT 135* 125*  --    BMET Recent Labs    08/28/20 0617 08/28/20 1349 08/29/20 0548 08/29/20 1124  NA 138   < > 144 144  K 4.3  --  3.4* 4.1  CL 107  --  116*  --   CO2 24  --  23  --   GLUCOSE 108*  --  124*  --   BUN 10  --  14  --   CREATININE 0.91  --  0.78  --   CALCIUM 8.7*  --  8.0*  --    < > = values in this interval not displayed.    Studies/Results: CT HEAD WO CONTRAST  Result Date: 08/28/2020 CLINICAL DATA:  Head trauma.  Abnormal mental status. EXAM: CT HEAD WITHOUT CONTRAST TECHNIQUE: Contiguous axial images were obtained from the base of the skull through the vertex without intravenous contrast. COMPARISON:  Two days ago FINDINGS: Brain: Bilateral inferior frontal hemorrhagic contusions also involving the right anterior inferior temporal and left posterior temporal lobes. Mild patchy superior and lateral left frontal hemorrhagic contusion. The smaller hemorrhagic contusions have become more apparent. The degree of adjacent low-density edema has increased. There is generalized effacement of sulci. No discrete  infarct. No midline shift or herniation. Subdural hematoma along the left tentorial leaflet that is non progressed. No ventriculomegaly. Vascular: Negative Skull: Bilateral temporal bone fracture which traverses the upper mastoid air cells. Bilateral mastoid and middle ear opacification. No visible otic capsule involvement. Extensive generalized scalp swelling Sinuses/Orbits: Intubation with mild progression of bilateral sinus opacification. IMPRESSION: 1. Increased edema associated with multifocal hemorrhagic contusions greatest in the anterior cranial fossa. 2. Diffuse effacement of sulci compatible with increased intracranial pressure. No herniation or midline shift. 3. Nonprogressive subdural hematoma along the left tentorium. 4. Bilateral temporal bone fracture traversing the mastoids. Electronically Signed   By: Marnee Spring M.D.   On: 08/28/2020 10:55    Assessment/Plan: Severe TBI with subfrontal contusions - cont ICP monitoring; drainage for ICP > 22 mmHg; EVD output 155 ml over 24 hours - maintain CPP > 60 mmHg - sedation to keep patient quiet and motionless, with paralytics if necessary - ok to start pharmacologic DVT prophylaxis Hep subQ 1000 units bid - Goal sodium 145-155 on 3%  Allen [name] 08/29/2020, 12:02 PM

## 2020-08-30 ENCOUNTER — Inpatient Hospital Stay (HOSPITAL_COMMUNITY): Payer: Medicaid Other

## 2020-08-30 LAB — BASIC METABOLIC PANEL
Anion gap: 10 (ref 5–15)
BUN: 11 mg/dL (ref 6–20)
CO2: 23 mmol/L (ref 22–32)
Calcium: 8.3 mg/dL — ABNORMAL LOW (ref 8.9–10.3)
Chloride: 116 mmol/L — ABNORMAL HIGH (ref 98–111)
Creatinine, Ser: 0.72 mg/dL (ref 0.61–1.24)
GFR, Estimated: >60 mL/min (ref >60)
Glucose, Bld: 132 mg/dL — ABNORMAL HIGH (ref 70–99)
Potassium: 3.7 mmol/L (ref 3.5–5.1)
Sodium: 149 mmol/L — ABNORMAL HIGH (ref 135–145)

## 2020-08-30 LAB — POCT I-STAT 7, (LYTES, BLD GAS, ICA,H+H)
Acid-base deficit: 2 mmol/L (ref 0.0–2.0)
Bicarbonate: 22 mmol/L (ref 20.0–28.0)
Calcium, Ion: 1.22 mmol/L (ref 1.15–1.40)
HCT: 33 % — ABNORMAL LOW (ref 39.0–52.0)
Hemoglobin: 11.2 g/dL — ABNORMAL LOW (ref 13.0–17.0)
O2 Saturation: 99 %
Patient temperature: 99
Potassium: 3.3 mmol/L — ABNORMAL LOW (ref 3.5–5.1)
Sodium: 150 mmol/L — ABNORMAL HIGH (ref 135–145)
TCO2: 23 mmol/L (ref 22–32)
pCO2 arterial: 33.8 mmHg (ref 32.0–48.0)
pH, Arterial: 7.422 (ref 7.350–7.450)
pO2, Arterial: 121 mmHg — ABNORMAL HIGH (ref 83.0–108.0)

## 2020-08-30 LAB — CBC
HCT: 35 % — ABNORMAL LOW (ref 39.0–52.0)
Hemoglobin: 11.7 g/dL — ABNORMAL LOW (ref 13.0–17.0)
MCH: 31.5 pg (ref 26.0–34.0)
MCHC: 33.4 g/dL (ref 30.0–36.0)
MCV: 94.1 fL (ref 80.0–100.0)
Platelets: 173 10*3/uL (ref 150–400)
RBC: 3.72 MIL/uL — ABNORMAL LOW (ref 4.22–5.81)
RDW: 12.7 % (ref 11.5–15.5)
WBC: 13.7 10*3/uL — ABNORMAL HIGH (ref 4.0–10.5)
nRBC: 0 % (ref 0.0–0.2)

## 2020-08-30 LAB — GLUCOSE, CAPILLARY
Glucose-Capillary: 118 mg/dL — ABNORMAL HIGH (ref 70–99)
Glucose-Capillary: 120 mg/dL — ABNORMAL HIGH (ref 70–99)
Glucose-Capillary: 124 mg/dL — ABNORMAL HIGH (ref 70–99)
Glucose-Capillary: 129 mg/dL — ABNORMAL HIGH (ref 70–99)
Glucose-Capillary: 135 mg/dL — ABNORMAL HIGH (ref 70–99)
Glucose-Capillary: 153 mg/dL — ABNORMAL HIGH (ref 70–99)

## 2020-08-30 LAB — SODIUM
Sodium: 147 mmol/L — ABNORMAL HIGH (ref 135–145)
Sodium: 150 mmol/L — ABNORMAL HIGH (ref 135–145)
Sodium: 153 mmol/L — ABNORMAL HIGH (ref 135–145)

## 2020-08-30 LAB — TRIGLYCERIDES: Triglycerides: 217 mg/dL — ABNORMAL HIGH (ref <150)

## 2020-08-30 MED ORDER — SODIUM CHLORIDE 0.9 % IV SOLN: INTRAVENOUS | Status: DC | PRN

## 2020-08-30 NOTE — Progress Notes (Signed)
Subjective: NAEs o/n  Objective: Vital signs in last 24 hours: Temp:  [98.9 F (37.2 C)-100.9 F (38.3 C)] 98.9 F (37.2 C) (04/12 0400) Pulse Rate:  [64-99] 91 (04/12 1100) Resp:  [0-27] 0 (04/12 1100) BP: (106-153)/(60-87) 135/76 (04/12 1100) SpO2:  [95 %-100 %] 100 % (04/12 1100) FiO2 (%):  [30 %] 30 % (04/12 0840) Weight:  [71.2 kg] 71.2 kg (04/12 0500)  Intake/Output from previous day: 04/11 0701 - 04/12 0700 In: 4503 [I.V.:3441.9; NG/GT:861.1; IV Piggyback:200] Out: 1950 [Urine:1695; Drains:255] Intake/Output this shift: Total I/O In: 351.4 [I.V.:351.4] Out: 725 [Urine:710; Drains:15]  Intubated, sedated, on vec, propofol, Fentanyl gtt Eyes closed, pupils 2 mm and sluggish bilaterally ICP currently 16 mmHg  Lab Results: Recent Labs    08/29/20 0548 08/29/20 1124 08/30/20 0255 08/30/20 0719  WBC 9.3  --   --  13.7*  HGB 11.5*   < > 11.2* 11.7*  HCT 32.8*   < > 33.0* 35.0*  PLT 125*  --   --  173   < > = values in this interval not displayed.   BMET Recent Labs    08/29/20 0548 08/29/20 1102 08/30/20 0255 08/30/20 0719  NA 144   < > 150* 149*  K 3.4*   < > 3.3* 3.7  CL 116*  --   --  116*  CO2 23  --   --  23  GLUCOSE 124*  --   --  132*  BUN 14  --   --  11  CREATININE 0.78  --   --  0.72  CALCIUM 8.0*  --   --  8.3*   < > = values in this interval not displayed.    Studies/Results: No results found.  Assessment/Plan: S/p TBI with bifrontal contusions - will change EVD to be open at 20 cm above ear, transduce ICP q 1hr - Na+ within ideal range 145-155, with room for bolus if needed; cont current rate - sedation, with pharmacologic paralytics as needed - will likely continue ICP monitoring for 5 more days  Bedelia Person 08/30/2020, 11:17 AM

## 2020-08-30 NOTE — Progress Notes (Addendum)
0732 : ICP > 22 x 5 min, 5 cc CSF drained 0749: 150 cc hypertonic saline bolus initiated (over 1 hour per order) 0754: ICP > 22 x 5 min, 5 cc CSF drained 0859: ICP > 22 x 5 min, 5 cc CSF drained per order 0909: 5 cc drained 0925: 5 cc drained
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
## 2020-08-31 ENCOUNTER — Inpatient Hospital Stay (HOSPITAL_COMMUNITY): Payer: Medicaid Other | Admitting: Certified Registered Nurse Anesthetist

## 2020-08-31 ENCOUNTER — Inpatient Hospital Stay (HOSPITAL_COMMUNITY): Payer: Medicaid Other

## 2020-08-31 LAB — CBC
HCT: 32.3 % — ABNORMAL LOW (ref 39.0–52.0)
Hemoglobin: 10.8 g/dL — ABNORMAL LOW (ref 13.0–17.0)
MCH: 32 pg (ref 26.0–34.0)
MCHC: 33.4 g/dL (ref 30.0–36.0)
MCV: 95.8 fL (ref 80.0–100.0)
Platelets: 176 10*3/uL (ref 150–400)
RBC: 3.37 MIL/uL — ABNORMAL LOW (ref 4.22–5.81)
RDW: 13.2 % (ref 11.5–15.5)
WBC: 13.9 10*3/uL — ABNORMAL HIGH (ref 4.0–10.5)
nRBC: 0 % (ref 0.0–0.2)

## 2020-08-31 LAB — BASIC METABOLIC PANEL
Anion gap: 4 — ABNORMAL LOW (ref 5–15)
BUN: 12 mg/dL (ref 6–20)
CO2: 24 mmol/L (ref 22–32)
Calcium: 7.8 mg/dL — ABNORMAL LOW (ref 8.9–10.3)
Chloride: 125 mmol/L — ABNORMAL HIGH (ref 98–111)
Creatinine, Ser: 0.8 mg/dL (ref 0.61–1.24)
GFR, Estimated: >60 mL/min (ref >60)
Glucose, Bld: 144 mg/dL — ABNORMAL HIGH (ref 70–99)
Potassium: 3 mmol/L — ABNORMAL LOW (ref 3.5–5.1)
Sodium: 153 mmol/L — ABNORMAL HIGH (ref 135–145)

## 2020-08-31 LAB — POCT I-STAT 7, (LYTES, BLD GAS, ICA,H+H)
Acid-base deficit: 1 mmol/L (ref 0.0–2.0)
Bicarbonate: 23.1 mmol/L (ref 20.0–28.0)
Calcium, Ion: 1.15 mmol/L (ref 1.15–1.40)
HCT: 26 % — ABNORMAL LOW (ref 39.0–52.0)
Hemoglobin: 8.8 g/dL — ABNORMAL LOW (ref 13.0–17.0)
O2 Saturation: 99 %
Patient temperature: 99.2
Potassium: 2.9 mmol/L — ABNORMAL LOW (ref 3.5–5.1)
Sodium: 160 mmol/L — ABNORMAL HIGH (ref 135–145)
TCO2: 24 mmol/L (ref 22–32)
pCO2 arterial: 35.7 mmHg (ref 32.0–48.0)
pH, Arterial: 7.42 (ref 7.350–7.450)
pO2, Arterial: 133 mmHg — ABNORMAL HIGH (ref 83.0–108.0)

## 2020-08-31 LAB — CDS SEROLOGY

## 2020-08-31 LAB — SODIUM
Sodium: 159 mmol/L — ABNORMAL HIGH (ref 135–145)
Sodium: 155 mmol/L — ABNORMAL HIGH (ref 135–145)

## 2020-08-31 LAB — GLUCOSE, CAPILLARY
Glucose-Capillary: 109 mg/dL — ABNORMAL HIGH (ref 70–99)
Glucose-Capillary: 110 mg/dL — ABNORMAL HIGH (ref 70–99)
Glucose-Capillary: 119 mg/dL — ABNORMAL HIGH (ref 70–99)
Glucose-Capillary: 129 mg/dL — ABNORMAL HIGH (ref 70–99)
Glucose-Capillary: 132 mg/dL — ABNORMAL HIGH (ref 70–99)
Glucose-Capillary: 144 mg/dL — ABNORMAL HIGH (ref 70–99)

## 2020-08-31 MED ORDER — SENNOSIDES 8.8 MG/5ML PO SYRP
5.0000 mL | ORAL_SOLUTION | Freq: Two times a day (BID) | ORAL | Status: DC
  Administered 2020-08-31 – 2020-09-06 (×13): 5 mL
  Filled 2020-08-31 (×13): qty 5

## 2020-08-31 MED ORDER — POLYETHYLENE GLYCOL 3350 17 G PO PACK
17.0000 g | PACK | Freq: Every day | ORAL | Status: DC
  Administered 2020-08-31 – 2020-09-06 (×7): 17 g
  Filled 2020-08-31 (×7): qty 1

## 2020-08-31 MED ORDER — FENTANYL CITRATE (PF) 2500 MCG/50ML IJ SOLN
50.0000 ug/h | Status: DC
  Administered 2020-08-31: 350 ug/h via INTRAVENOUS
  Administered 2020-08-31: 300 ug/h via INTRAVENOUS
  Administered 2020-09-01 – 2020-09-02 (×3): 400 ug/h via INTRAVENOUS
  Administered 2020-09-02: 375 ug/h via INTRAVENOUS
  Administered 2020-09-03 – 2020-09-04 (×3): 300 ug/h via INTRAVENOUS
  Administered 2020-09-05: 200 ug/h via INTRAVENOUS
  Administered 2020-09-06: 100 ug/h via INTRAVENOUS
  Filled 2020-08-31 (×7): qty 100
  Filled 2020-08-31: qty 50
  Filled 2020-08-31 (×3): qty 100
  Filled 2020-08-31 (×2): qty 50
  Filled 2020-08-31 (×2): qty 100

## 2020-08-31 MED ORDER — POTASSIUM CHLORIDE 20 MEQ PO PACK
40.0000 meq | PACK | Freq: Two times a day (BID) | ORAL | Status: AC
  Administered 2020-08-31 (×2): 40 meq
  Filled 2020-08-31 (×3): qty 2

## 2020-08-31 NOTE — Procedures (Signed)
   Procedure Note  Date: 08/30/2020  Procedure: central venous catheter placement--right, internal jugular vein, with ultrasound guidance  Pre-op diagnosis: cerebral edema Post-op diagnosis: same  Surgeon: Jesusita Oka, MD  Anesthesia: local  EBL: <5cc Drains/Implants: 16 cm, triple lumen central venous catheter  Description of procedure: Time-out was performed verifying correct patient, procedure, site, laterality, and signature of informed consent. The right neck was prepped and draped in the usual sterile fashion. The right internal jugular vein was localized with ultrasound guidance. Five ccs of local anesthetic was infiltrated at the site of venous access. The right internal jugular vein was accessed using an introducer needle and a guidewire passed through the needle. The needle was removed and a skin nick was made. The tract was dilated and the central venous catheter advanced over the guidewire followed by removal of the guidewire. All ports drew blood easily and all were flushed with saline. The catheter was secured to the skin with suture and a sterile dressing. The patient tolerated the procedure well. There were no immediate complications. Follow up chest x-ray was ordered to confirm positioning and the absence of a pneumothorax.   Jesusita Oka, MD General and Trauma Surgery [facility]    Procedure Note  Date: 08/30/2020  Procedure: arterial line placement--left, radial artery, without ultrasound guidance  Pre-op diagnosis: cerebral edema Post-op diagnosis: same  Surgeon: Jesusita Oka, MD  Anesthesia: none  EBL: <5cc Drains/Implants: 4 cm arterial catheter  Description of procedure: Time-out was performed verifying correct patient, procedure, site, laterality, and signature of informed consent. The left wrist was prepped and draped in the usual sterile fashion. The artery was localized using anatomic landmarks and accessed using an arterial  catheterization kit after 1 attempt(s). The wire was advanced and the sheath advanced over the wire. The needle and wire were removed with pulsatile, bright red blood noted through the catheter. The catheter was connected to a transducer and a good waveform was noted. The catheter was secured in place with suture and tegaderm. The patient tolerated the procedure well. There were no complications.    Jesusita Oka, MD General and [place] [facility]
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
## 2020-08-31 NOTE — Progress Notes (Signed)
Neurosurgery  Patient seen and examined this morning. No acute events overnight.  Intubated, sedated, on propofol and fentanyl with vecuronium drip. Pinpoint pupils. No mvt  ICPs well controlled, EVD open at 20  S/p TBI - 3% stopped for Na 160.  Will cont to monitor, keep Na in 145-155 range - possible evd removal 4/15 if icps remain controlled.  Will raise drain to 25 tomorrow

## 2020-08-31 NOTE — Anesthesia Procedure Notes (Signed)
Procedure Name: Intubation Date/Time: 08/31/2020 11:40 PM Performed by: Magalene Mclear T, CRNA Pre-anesthesia Checklist: Patient identified, Emergency Drugs available, Suction available and Patient being monitored Patient Re-evaluated:Patient Re-evaluated prior to induction Oxygen Delivery Method: Ambu bag Preoxygenation: Pre-oxygenation with 100% oxygen Induction Type: IV induction Laryngoscope Size: Glidescope and 3 Grade View: Grade I Number of attempts: 1 Airway Equipment and Method: Video-laryngoscopy and Bougie stylet Placement Confirmation: ETT inserted through vocal cords under direct vision,  positive ETCO2,  CO2 detector and breath sounds checked- equal and bilateral Secured at: 25 cm Tube secured with: Tape Dental Injury: Teeth and Oropharynx as per pre-operative assessment

## 2020-09-01 LAB — CBC
HCT: 32 % — ABNORMAL LOW (ref 39.0–52.0)
Hemoglobin: 10.7 g/dL — ABNORMAL LOW (ref 13.0–17.0)
MCH: 31.6 pg (ref 26.0–34.0)
MCHC: 33.4 g/dL (ref 30.0–36.0)
MCV: 94.4 fL (ref 80.0–100.0)
Platelets: 224 10*3/uL (ref 150–400)
RBC: 3.39 MIL/uL — ABNORMAL LOW (ref 4.22–5.81)
RDW: 13.5 % (ref 11.5–15.5)
WBC: 12.1 10*3/uL — ABNORMAL HIGH (ref 4.0–10.5)
nRBC: 0.2 % (ref 0.0–0.2)

## 2020-09-01 LAB — BLOOD GAS, ARTERIAL
Acid-base deficit: 0.2 mmol/L (ref 0.0–2.0)
Bicarbonate: 23.3 mmol/L (ref 20.0–28.0)
Drawn by: 44135
FIO2: 30
O2 Saturation: 97.2 %
Patient temperature: 37
pCO2 arterial: 33.8 mmHg (ref 32.0–48.0)
pH, Arterial: 7.452 — ABNORMAL HIGH (ref 7.350–7.450)
pO2, Arterial: 88.5 mmHg (ref 83.0–108.0)

## 2020-09-01 LAB — BASIC METABOLIC PANEL
Anion gap: 7 (ref 5–15)
BUN: 15 mg/dL (ref 6–20)
CO2: 22 mmol/L (ref 22–32)
Calcium: 8.5 mg/dL — ABNORMAL LOW (ref 8.9–10.3)
Chloride: 126 mmol/L — ABNORMAL HIGH (ref 98–111)
Creatinine, Ser: 0.84 mg/dL (ref 0.61–1.24)
GFR, Estimated: >60 mL/min (ref >60)
Glucose, Bld: 119 mg/dL — ABNORMAL HIGH (ref 70–99)
Potassium: 3.8 mmol/L (ref 3.5–5.1)
Sodium: 155 mmol/L — ABNORMAL HIGH (ref 135–145)

## 2020-09-01 LAB — SODIUM
Sodium: 152 mmol/L — ABNORMAL HIGH (ref 135–145)
Sodium: 152 mmol/L — ABNORMAL HIGH (ref 135–145)
Sodium: 149 mmol/L — ABNORMAL HIGH (ref 135–145)

## 2020-09-01 LAB — GLUCOSE, CAPILLARY
Glucose-Capillary: 108 mg/dL — ABNORMAL HIGH (ref 70–99)
Glucose-Capillary: 120 mg/dL — ABNORMAL HIGH (ref 70–99)
Glucose-Capillary: 130 mg/dL — ABNORMAL HIGH (ref 70–99)
Glucose-Capillary: 140 mg/dL — ABNORMAL HIGH (ref 70–99)
Glucose-Capillary: 142 mg/dL — ABNORMAL HIGH (ref 70–99)
Glucose-Capillary: 151 mg/dL — ABNORMAL HIGH (ref 70–99)
Glucose-Capillary: 151 mg/dL — ABNORMAL HIGH (ref 70–99)

## 2020-09-01 LAB — TRIGLYCERIDES
Triglycerides: 682 mg/dL — ABNORMAL HIGH (ref <150)
Triglycerides: 896 mg/dL — ABNORMAL HIGH (ref <150)

## 2020-09-01 MED ORDER — MIDAZOLAM BOLUS VIA INFUSION
0.0000 mg | INTRAVENOUS | Status: DC | PRN
  Administered 2020-09-01 – 2020-09-02 (×3): 2 mg via INTRAVENOUS
  Filled 2020-09-01: qty 5

## 2020-09-01 MED ORDER — MIDAZOLAM 50MG/50ML (1MG/ML) PREMIX INFUSION
0.0000 mg/h | INTRAVENOUS | Status: DC
  Administered 2020-09-01: 5 mg/h via INTRAVENOUS
  Administered 2020-09-01 – 2020-09-02 (×4): 8 mg/h via INTRAVENOUS
  Filled 2020-09-01 (×5): qty 50

## 2020-09-01 MED ORDER — LABETALOL HCL 5 MG/ML IV SOLN
INTRAVENOUS | Status: AC
  Filled 2020-09-01: qty 4

## 2020-09-01 MED ORDER — LABETALOL HCL 5 MG/ML IV SOLN
10.0000 mg | INTRAVENOUS | Status: DC | PRN
  Administered 2020-09-01 (×5): 10 mg via INTRAVENOUS
  Filled 2020-09-01 (×4): qty 4

## 2020-09-01 MED ORDER — HYDRALAZINE HCL 20 MG/ML IJ SOLN
10.0000 mg | Freq: Four times a day (QID) | INTRAMUSCULAR | Status: DC | PRN
  Administered 2020-09-01: 10 mg via INTRAVENOUS
  Filled 2020-09-01: qty 1

## 2020-09-01 MED ORDER — DOCUSATE SODIUM 50 MG/5ML PO LIQD
100.0000 mg | Freq: Two times a day (BID) | ORAL | Status: DC
  Administered 2020-09-01 – 2020-09-12 (×15): 100 mg
  Filled 2020-09-01 (×18): qty 10
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
## 2020-09-01 NOTE — Addendum Note (Signed)
Addendum  created 09/01/20 0814 by Marcene Duos, MD   Attestation recorded in Intraprocedure, Intraprocedure Attestations filed, Optician, dispensing edited
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
## 2020-09-01 NOTE — Progress Notes (Signed)
SBP according to a-line 180-190s despite multiple doses of labetalol and versed and fentanyl boluses. Cuff SBP 130, but DBP and MAP correlate with the a-line. A-line zeroed and pressure remains unchanged. Dr. Janee Morn aware, new orders received.

## 2020-09-01 NOTE — Progress Notes (Signed)
SBP continues to remain unchanged, per Dr. Janee Morn it is ok to go by the cuff pressure.

## 2020-09-01 NOTE — Progress Notes (Signed)
Patient ID: Allen [name], male   DOB: 1985/12/27, 35 y.o.   MRN: [id number] I met with his grandmother on the unit and updated her on his progress and the plan of care.  Georganna Skeans, MD, MPH, FACS Please use [email or web address] to contact on call provider

## 2020-09-01 NOTE — TOC Progression Note (Signed)
Transition of Care [facility]) - Progression Note    Patient Details  Name: Allen [name] MRN: [id number] Date of Birth: 1985-08-18  Transition of Care [facility]) CM/SW Contact  Glennon Mac, RN Phone Number: 09/01/2020, 4:05 PM  Clinical Narrative:  [facility] Case Manager continues to follow for discharge planning.  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  Will follow as pt progresses.      Expected Discharge Plan: IP Rehab Facility Barriers to Discharge: Continued Medical Work up  Expected Discharge Plan and Services Expected Discharge Plan: IP Rehab Facility   Discharge Planning Services: CM Consult   Living arrangements for the past 2 months: Single Family Home                                       Social Determinants of Health (SDOH) Interventions    Readmission Risk Interventions No flowsheet data found.  Quintella Baton, RN, BSN  Trauma/Neuro ICU Case Manager [phone]

## 2020-09-02 ENCOUNTER — Inpatient Hospital Stay (HOSPITAL_COMMUNITY): Payer: Medicaid Other

## 2020-09-02 LAB — BASIC METABOLIC PANEL
Anion gap: 4 — ABNORMAL LOW (ref 5–15)
BUN: 17 mg/dL (ref 6–20)
CO2: 26 mmol/L (ref 22–32)
Calcium: 8.8 mg/dL — ABNORMAL LOW (ref 8.9–10.3)
Chloride: 116 mmol/L — ABNORMAL HIGH (ref 98–111)
Creatinine, Ser: 0.76 mg/dL (ref 0.61–1.24)
GFR, Estimated: >60 mL/min (ref >60)
Glucose, Bld: 140 mg/dL — ABNORMAL HIGH (ref 70–99)
Potassium: 3.9 mmol/L (ref 3.5–5.1)
Sodium: 146 mmol/L — ABNORMAL HIGH (ref 135–145)

## 2020-09-02 LAB — GLUCOSE, CAPILLARY
Glucose-Capillary: 106 mg/dL — ABNORMAL HIGH (ref 70–99)
Glucose-Capillary: 116 mg/dL — ABNORMAL HIGH (ref 70–99)
Glucose-Capillary: 127 mg/dL — ABNORMAL HIGH (ref 70–99)
Glucose-Capillary: 131 mg/dL — ABNORMAL HIGH (ref 70–99)
Glucose-Capillary: 140 mg/dL — ABNORMAL HIGH (ref 70–99)
Glucose-Capillary: 144 mg/dL — ABNORMAL HIGH (ref 70–99)

## 2020-09-02 LAB — CBC
HCT: 35.9 % — ABNORMAL LOW (ref 39.0–52.0)
Hemoglobin: 12 g/dL — ABNORMAL LOW (ref 13.0–17.0)
MCH: 31.3 pg (ref 26.0–34.0)
MCHC: 33.4 g/dL (ref 30.0–36.0)
MCV: 93.7 fL (ref 80.0–100.0)
Platelets: 286 10*3/uL (ref 150–400)
RBC: 3.83 MIL/uL — ABNORMAL LOW (ref 4.22–5.81)
RDW: 13.7 % (ref 11.5–15.5)
WBC: 18 10*3/uL — ABNORMAL HIGH (ref 4.0–10.5)
nRBC: 0.3 % — ABNORMAL HIGH (ref 0.0–0.2)

## 2020-09-02 LAB — SODIUM
Sodium: 153 mmol/L — ABNORMAL HIGH (ref 135–145)
Sodium: 152 mmol/L — ABNORMAL HIGH (ref 135–145)
Sodium: 153 mmol/L — ABNORMAL HIGH (ref 135–145)

## 2020-09-02 LAB — PROTEIN AND GLUCOSE, CSF
Glucose, CSF: 102 mg/dL — ABNORMAL HIGH (ref 40–70)
Total  Protein, CSF: <6 mg/dL — ABNORMAL LOW (ref 15–45)

## 2020-09-02 LAB — CSF CELL COUNT WITH DIFFERENTIAL
RBC Count, CSF: 1485 /mm3 — ABNORMAL HIGH
WBC, CSF: 1 /mm3 (ref 0–5)

## 2020-09-02 MED ORDER — DEXMEDETOMIDINE HCL IN NACL 400 MCG/100ML IV SOLN
0.0000 ug/kg/h | INTRAVENOUS | Status: AC
  Administered 2020-09-02: 0.9 ug/kg/h via INTRAVENOUS
  Administered 2020-09-02: 0.4 ug/kg/h via INTRAVENOUS
  Administered 2020-09-03 (×3): 1.2 ug/kg/h via INTRAVENOUS
  Administered 2020-09-03: 0.9 ug/kg/h via INTRAVENOUS
  Administered 2020-09-04 – 2020-09-05 (×6): 1.2 ug/kg/h via INTRAVENOUS
  Administered 2020-09-05: 1.199 ug/kg/h via INTRAVENOUS
  Administered 2020-09-05: 1.2 ug/kg/h via INTRAVENOUS
  Administered 2020-09-05: 1.199 ug/kg/h via INTRAVENOUS
  Filled 2020-09-02 (×5): qty 100
  Filled 2020-09-02: qty 200
  Filled 2020-09-02 (×6): qty 100
  Filled 2020-09-02: qty 200
  Filled 2020-09-02: qty 100

## 2020-09-02 MED ORDER — DEXMEDETOMIDINE BOLUS VIA INFUSION: 1.0000 ug/kg | Freq: Once | INTRAVENOUS | Status: DC

## 2020-09-02 MED ORDER — HYDRALAZINE HCL 20 MG/ML IJ SOLN: 10.0000 mg | Freq: Four times a day (QID) | INTRAMUSCULAR | Status: DC | PRN

## 2020-09-02 NOTE — Progress Notes (Signed)
Paged Dr. Danielle Dess in regards to Na 149. Awaiting call back in regard to restarting 3%.
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
## 2020-09-02 NOTE — Progress Notes (Addendum)
Na 146, paged Dr. Danielle Dess, new orders received.  Restart 3% @50ml /hr

## 2020-09-02 NOTE — Progress Notes (Signed)
Morning ABG results: 7.43, 41.7, 93, 27.7, 97%
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
## 2020-09-03 LAB — POCT I-STAT 7, (LYTES, BLD GAS, ICA,H+H)
Acid-Base Excess: 3 mmol/L — ABNORMAL HIGH (ref 0.0–2.0)
Bicarbonate: 27.7 mmol/L (ref 20.0–28.0)
Calcium, Ion: 1.2 mmol/L (ref 1.15–1.40)
HCT: 33 % — ABNORMAL LOW (ref 39.0–52.0)
Hemoglobin: 11.2 g/dL — ABNORMAL LOW (ref 13.0–17.0)
O2 Saturation: 97 %
Patient temperature: 100.6
Potassium: 3.9 mmol/L (ref 3.5–5.1)
Sodium: 152 mmol/L — ABNORMAL HIGH (ref 135–145)
TCO2: 29 mmol/L (ref 22–32)
pCO2 arterial: 41.7 mmHg (ref 32.0–48.0)
pH, Arterial: 7.435 (ref 7.350–7.450)
pO2, Arterial: 93 mmHg (ref 83.0–108.0)

## 2020-09-03 LAB — CBC
HCT: 26.5 % — ABNORMAL LOW (ref 39.0–52.0)
Hemoglobin: 8.6 g/dL — ABNORMAL LOW (ref 13.0–17.0)
MCH: 31.2 pg (ref 26.0–34.0)
MCHC: 32.5 g/dL (ref 30.0–36.0)
MCV: 96 fL (ref 80.0–100.0)
Platelets: 245 10*3/uL (ref 150–400)
RBC: 2.76 MIL/uL — ABNORMAL LOW (ref 4.22–5.81)
RDW: 13.7 % (ref 11.5–15.5)
WBC: 12.5 10*3/uL — ABNORMAL HIGH (ref 4.0–10.5)
nRBC: 0.2 % (ref 0.0–0.2)

## 2020-09-03 LAB — BASIC METABOLIC PANEL
Anion gap: 8 (ref 5–15)
BUN: 22 mg/dL — ABNORMAL HIGH (ref 6–20)
CO2: 23 mmol/L (ref 22–32)
Calcium: 7.5 mg/dL — ABNORMAL LOW (ref 8.9–10.3)
Chloride: 122 mmol/L — ABNORMAL HIGH (ref 98–111)
Creatinine, Ser: 0.79 mg/dL (ref 0.61–1.24)
GFR, Estimated: >60 mL/min (ref >60)
Glucose, Bld: 125 mg/dL — ABNORMAL HIGH (ref 70–99)
Potassium: 3.5 mmol/L (ref 3.5–5.1)
Sodium: 153 mmol/L — ABNORMAL HIGH (ref 135–145)

## 2020-09-03 LAB — SODIUM
Sodium: 154 mmol/L — ABNORMAL HIGH (ref 135–145)
Sodium: 152 mmol/L — ABNORMAL HIGH (ref 135–145)

## 2020-09-03 LAB — TRIGLYCERIDES: Triglycerides: 156 mg/dL — ABNORMAL HIGH (ref <150)

## 2020-09-03 LAB — GLUCOSE, CAPILLARY: Glucose-Capillary: 116 mg/dL — ABNORMAL HIGH (ref 70–99)

## 2020-09-03 MED ORDER — POTASSIUM CHLORIDE 20 MEQ PO PACK
20.0000 meq | PACK | Freq: Once | ORAL | Status: AC
  Administered 2020-09-03: 20 meq
  Filled 2020-09-03: qty 1
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
## 2020-09-03 NOTE — Progress Notes (Signed)
Patient ID: Allen [name], male   DOB: 21-Apr-1986, 35 y.o.   MRN: [id number] BP 124/73   Pulse 74   Temp (!) 101.5 F (38.6 C) (Axillary)   Resp (!) 30   Ht 5\' 10"  (1.778 m)   Wt 74.4 kg   SpO2 96%   BMI 23.53 kg/m  Intubated sedated Perrl, +cough, +gag Ventricular catheter remains in place, draining well

## 2020-09-04 LAB — BASIC METABOLIC PANEL
Anion gap: 4 — ABNORMAL LOW (ref 5–15)
BUN: 26 mg/dL — ABNORMAL HIGH (ref 6–20)
CO2: 27 mmol/L (ref 22–32)
Calcium: 8.7 mg/dL — ABNORMAL LOW (ref 8.9–10.3)
Chloride: 122 mmol/L — ABNORMAL HIGH (ref 98–111)
Creatinine, Ser: 0.74 mg/dL (ref 0.61–1.24)
GFR, Estimated: >60 mL/min (ref >60)
Glucose, Bld: 142 mg/dL — ABNORMAL HIGH (ref 70–99)
Potassium: 3.7 mmol/L (ref 3.5–5.1)
Sodium: 153 mmol/L — ABNORMAL HIGH (ref 135–145)

## 2020-09-04 LAB — CBC
HCT: 34.7 % — ABNORMAL LOW (ref 39.0–52.0)
Hemoglobin: 11.1 g/dL — ABNORMAL LOW (ref 13.0–17.0)
MCH: 31 pg (ref 26.0–34.0)
MCHC: 32 g/dL (ref 30.0–36.0)
MCV: 96.9 fL (ref 80.0–100.0)
Platelets: 334 10*3/uL (ref 150–400)
RBC: 3.58 MIL/uL — ABNORMAL LOW (ref 4.22–5.81)
RDW: 13.5 % (ref 11.5–15.5)
WBC: 13 10*3/uL — ABNORMAL HIGH (ref 4.0–10.5)
nRBC: 0.2 % (ref 0.0–0.2)

## 2020-09-04 LAB — SODIUM
Sodium: 153 mmol/L — ABNORMAL HIGH (ref 135–145)
Sodium: 155 mmol/L — ABNORMAL HIGH (ref 135–145)
Sodium: 154 mmol/L — ABNORMAL HIGH (ref 135–145)
Sodium: 153 mmol/L — ABNORMAL HIGH (ref 135–145)

## 2020-09-04 MED ORDER — QUETIAPINE FUMARATE 25 MG PO TABS
25.0000 mg | ORAL_TABLET | Freq: Two times a day (BID) | ORAL | Status: DC
  Administered 2020-09-04 – 2020-09-06 (×5): 25 mg
  Filled 2020-09-04 (×5): qty 1

## 2020-09-04 MED ORDER — OXYCODONE HCL 5 MG/5ML PO SOLN
5.0000 mg | ORAL | Status: DC | PRN
  Administered 2020-09-04 – 2020-09-09 (×14): 10 mg
  Administered 2020-09-10 – 2020-09-11 (×3): 5 mg
  Administered 2020-09-11: 10 mg
  Filled 2020-09-04 (×9): qty 10
  Filled 2020-09-04: qty 5
  Filled 2020-09-04: qty 10
  Filled 2020-09-04: qty 5
  Filled 2020-09-04 (×4): qty 10
  Filled 2020-09-04: qty 5
  Filled 2020-09-04 (×2): qty 10

## 2020-09-04 MED ORDER — METHOCARBAMOL 500 MG PO TABS
1000.0000 mg | ORAL_TABLET | Freq: Three times a day (TID) | ORAL | Status: DC
  Administered 2020-09-04 – 2020-09-12 (×25): 1000 mg
  Filled 2020-09-04 (×26): qty 2

## 2020-09-04 NOTE — Progress Notes (Signed)
Patient ID: Allen [name], male   DOB: [birth year], 35 y.o.   MRN: [id number] BP 129/73   Pulse 76   Temp 99.5 F (37.5 C) (Esophageal)   Resp (!) 0   Ht 5\' 10"  (1.778 m)   Wt 72.7 kg   SpO2 98%   BMI 23.00 kg/m  Alert, following commands Ventricular catheter removed.  Possible extubation today
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
## 2020-09-05 ENCOUNTER — Inpatient Hospital Stay (HOSPITAL_COMMUNITY): Payer: Medicaid Other

## 2020-09-05 LAB — BASIC METABOLIC PANEL
Anion gap: 8 (ref 5–15)
BUN: 23 mg/dL — ABNORMAL HIGH (ref 6–20)
CO2: 26 mmol/L (ref 22–32)
Calcium: 9.1 mg/dL (ref 8.9–10.3)
Chloride: 113 mmol/L — ABNORMAL HIGH (ref 98–111)
Creatinine, Ser: 0.71 mg/dL (ref 0.61–1.24)
GFR, Estimated: >60 mL/min (ref >60)
Glucose, Bld: 110 mg/dL — ABNORMAL HIGH (ref 70–99)
Potassium: 3.2 mmol/L — ABNORMAL LOW (ref 3.5–5.1)
Sodium: 147 mmol/L — ABNORMAL HIGH (ref 135–145)

## 2020-09-05 LAB — CSF CULTURE W GRAM STAIN
Culture: NO GROWTH
Gram Stain: NONE SEEN

## 2020-09-05 LAB — CBC
HCT: 36.1 % — ABNORMAL LOW (ref 39.0–52.0)
Hemoglobin: 11.6 g/dL — ABNORMAL LOW (ref 13.0–17.0)
MCH: 31.4 pg (ref 26.0–34.0)
MCHC: 32.1 g/dL (ref 30.0–36.0)
MCV: 97.6 fL (ref 80.0–100.0)
Platelets: 367 10*3/uL (ref 150–400)
RBC: 3.7 MIL/uL — ABNORMAL LOW (ref 4.22–5.81)
RDW: 13.2 % (ref 11.5–15.5)
WBC: 24 10*3/uL — ABNORMAL HIGH (ref 4.0–10.5)
nRBC: 0.1 % (ref 0.0–0.2)

## 2020-09-05 LAB — SODIUM
Sodium: 146 mmol/L — ABNORMAL HIGH (ref 135–145)
Sodium: 149 mmol/L — ABNORMAL HIGH (ref 135–145)

## 2020-09-05 MED ORDER — VANCOMYCIN HCL 750 MG/150ML IV SOLN
750.0000 mg | Freq: Three times a day (TID) | INTRAVENOUS | Status: DC
  Administered 2020-09-05 – 2020-09-07 (×5): 750 mg via INTRAVENOUS
  Filled 2020-09-05 (×6): qty 150

## 2020-09-05 MED ORDER — VANCOMYCIN HCL 1500 MG/300ML IV SOLN
1500.0000 mg | Freq: Once | INTRAVENOUS | Status: AC
  Administered 2020-09-05: 1500 mg via INTRAVENOUS
  Filled 2020-09-05: qty 300

## 2020-09-05 MED ORDER — DEXMEDETOMIDINE HCL IN NACL 400 MCG/100ML IV SOLN
0.0000 ug/kg/h | INTRAVENOUS | Status: DC
  Administered 2020-09-05: 1.2 ug/kg/h via INTRAVENOUS
  Administered 2020-09-06: 1.042 ug/kg/h via INTRAVENOUS
  Filled 2020-09-05: qty 200
  Filled 2020-09-05 (×2): qty 100

## 2020-09-05 MED ORDER — POTASSIUM CHLORIDE 20 MEQ PO PACK
40.0000 meq | PACK | Freq: Two times a day (BID) | ORAL | Status: DC
  Filled 2020-09-05: qty 2

## 2020-09-05 MED ORDER — POTASSIUM CHLORIDE 20 MEQ PO PACK
40.0000 meq | PACK | Freq: Two times a day (BID) | ORAL | Status: AC
  Administered 2020-09-05: 40 meq

## 2020-09-05 MED ORDER — PIVOT 1.5 CAL PO LIQD
1000.0000 mL | ORAL | Status: DC
  Administered 2020-09-06 – 2020-09-08 (×3): 1000 mL
  Filled 2020-09-05 (×3): qty 1000
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
## 2020-09-05 NOTE — Progress Notes (Signed)
Patient has a CVC line not a PICC and RN states she will attempt PIVs for the patient
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
## 2020-09-06 ENCOUNTER — Inpatient Hospital Stay (HOSPITAL_COMMUNITY): Payer: Medicaid Other

## 2020-09-06 LAB — CBC
HCT: 31.9 % — ABNORMAL LOW (ref 39.0–52.0)
Hemoglobin: 10.3 g/dL — ABNORMAL LOW (ref 13.0–17.0)
MCH: 30.8 pg (ref 26.0–34.0)
MCHC: 32.3 g/dL (ref 30.0–36.0)
MCV: 95.5 fL (ref 80.0–100.0)
Platelets: 328 10*3/uL (ref 150–400)
RBC: 3.34 MIL/uL — ABNORMAL LOW (ref 4.22–5.81)
RDW: 12.9 % (ref 11.5–15.5)
WBC: 17.9 10*3/uL — ABNORMAL HIGH (ref 4.0–10.5)
nRBC: 0.1 % (ref 0.0–0.2)

## 2020-09-06 LAB — CULTURE, RESPIRATORY W GRAM STAIN

## 2020-09-06 LAB — BASIC METABOLIC PANEL
Anion gap: 7 (ref 5–15)
BUN: 29 mg/dL — ABNORMAL HIGH (ref 6–20)
CO2: 28 mmol/L (ref 22–32)
Calcium: 8.6 mg/dL — ABNORMAL LOW (ref 8.9–10.3)
Chloride: 113 mmol/L — ABNORMAL HIGH (ref 98–111)
Creatinine, Ser: 0.74 mg/dL (ref 0.61–1.24)
GFR, Estimated: >60 mL/min (ref >60)
Glucose, Bld: 167 mg/dL — ABNORMAL HIGH (ref 70–99)
Potassium: 3.4 mmol/L — ABNORMAL LOW (ref 3.5–5.1)
Sodium: 148 mmol/L — ABNORMAL HIGH (ref 135–145)

## 2020-09-06 MED ORDER — QUETIAPINE FUMARATE 50 MG PO TABS
50.0000 mg | ORAL_TABLET | Freq: Two times a day (BID) | ORAL | Status: DC
  Administered 2020-09-06 – 2020-09-12 (×12): 50 mg
  Filled 2020-09-06: qty 1
  Filled 2020-09-06: qty 2
  Filled 2020-09-06: qty 1
  Filled 2020-09-06: qty 2
  Filled 2020-09-06 (×8): qty 1

## 2020-09-06 MED ORDER — QUETIAPINE FUMARATE 25 MG PO TABS
25.0000 mg | ORAL_TABLET | Freq: Once | ORAL | Status: AC
  Administered 2020-09-06: 25 mg
  Filled 2020-09-06: qty 1

## 2020-09-06 MED ORDER — POTASSIUM CHLORIDE 20 MEQ PO PACK
40.0000 meq | PACK | Freq: Once | ORAL | Status: AC
  Administered 2020-09-06: 40 meq
  Filled 2020-09-06: qty 2

## 2020-09-06 MED ORDER — QUETIAPINE FUMARATE 25 MG PO TABS: 25.0000 mg | ORAL_TABLET | Freq: Every day | ORAL | Status: DC

## 2020-09-06 MED ORDER — FENTANYL CITRATE (PF) 100 MCG/2ML IJ SOLN
50.0000 ug | INTRAMUSCULAR | Status: DC | PRN
  Administered 2020-09-10: 50 ug via INTRAVENOUS
  Filled 2020-09-06: qty 2

## 2020-09-06 MED ORDER — CLONAZEPAM 0.125 MG PO TBDP
1.0000 mg | ORAL_TABLET | Freq: Two times a day (BID) | ORAL | Status: DC
  Filled 2020-09-06: qty 8

## 2020-09-06 MED ORDER — HALOPERIDOL LACTATE 5 MG/ML IJ SOLN
5.0000 mg | Freq: Four times a day (QID) | INTRAMUSCULAR | Status: DC | PRN
  Administered 2020-09-06: 5 mg via INTRAVENOUS
  Administered 2020-09-08 – 2020-09-11 (×3): 10 mg via INTRAVENOUS
  Filled 2020-09-06 (×4): qty 2
  Filled 2020-09-06 (×2): qty 1

## 2020-09-06 MED ORDER — LORAZEPAM 2 MG/ML IJ SOLN: 0.5000 mg | Freq: Four times a day (QID) | INTRAMUSCULAR | Status: DC | PRN
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
## 2020-09-06 NOTE — Progress Notes (Signed)
Patient increasingly agitated, sinus tach rate 142.  Night shift nurse Marcelino Duster, RN at bedside and decided it was best to start precedex drip since patient has been given PRN haldol and it has not been effective. Patient's wife at bedside.

## 2020-09-06 NOTE — Progress Notes (Signed)
120 cc of fentanyl drip wasted by this RN and Evlyn Clines, RN.

## 2020-09-06 NOTE — Plan of Care (Signed)
  Problem: Health Behavior/Discharge Planning: Goal: Ability to manage health-related needs will improve Outcome: Progressing   Problem: Clinical Measurements: Goal: Ability to maintain clinical measurements within normal limits will improve Outcome: Progressing Goal: Will remain free from infection Outcome: Progressing   

## 2020-09-06 NOTE — Procedures (Signed)
Extubation Procedure Note  Patient Details:   Name: BODE PIEPER DOB: Jan 21, 1986 MRN: [id number]   Airway Documentation:    Vent end date: 09/06/20 Vent end time: 0950   Evaluation  O2 sats: stable throughout Complications: No apparent complications Patient did tolerate procedure well. Bilateral Breath Sounds: Rhonchi   Patient extubated per MD order & placed on 3L [place]. Patient able to speak & has good strong cough.  Jacqulynn Cadet 09/06/2020, 9:55 AM

## 2020-09-07 ENCOUNTER — Inpatient Hospital Stay (HOSPITAL_COMMUNITY): Payer: Medicaid Other

## 2020-09-07 LAB — BASIC METABOLIC PANEL
Anion gap: 8 (ref 5–15)
BUN: 22 mg/dL — ABNORMAL HIGH (ref 6–20)
CO2: 28 mmol/L (ref 22–32)
Calcium: 9 mg/dL (ref 8.9–10.3)
Chloride: 110 mmol/L (ref 98–111)
Creatinine, Ser: 0.67 mg/dL (ref 0.61–1.24)
GFR, Estimated: >60 mL/min (ref >60)
Glucose, Bld: 155 mg/dL — ABNORMAL HIGH (ref 70–99)
Potassium: 3.3 mmol/L — ABNORMAL LOW (ref 3.5–5.1)
Sodium: 146 mmol/L — ABNORMAL HIGH (ref 135–145)

## 2020-09-07 LAB — CBC
HCT: 34.2 % — ABNORMAL LOW (ref 39.0–52.0)
Hemoglobin: 11.2 g/dL — ABNORMAL LOW (ref 13.0–17.0)
MCH: 31.1 pg (ref 26.0–34.0)
MCHC: 32.7 g/dL (ref 30.0–36.0)
MCV: 95 fL (ref 80.0–100.0)
Platelets: 384 10*3/uL (ref 150–400)
RBC: 3.6 MIL/uL — ABNORMAL LOW (ref 4.22–5.81)
RDW: 12.7 % (ref 11.5–15.5)
WBC: 15.4 10*3/uL — ABNORMAL HIGH (ref 4.0–10.5)
nRBC: 0 % (ref 0.0–0.2)

## 2020-09-07 MED ORDER — POTASSIUM CHLORIDE 20 MEQ PO PACK
40.0000 meq | PACK | ORAL | Status: AC
  Administered 2020-09-07 (×2): 40 meq via ORAL
  Filled 2020-09-07 (×2): qty 2

## 2020-09-07 MED ORDER — ACETAMINOPHEN 160 MG/5ML PO SOLN
1000.0000 mg | Freq: Four times a day (QID) | ORAL | Status: DC
  Administered 2020-09-07 – 2020-09-12 (×20): 1000 mg
  Filled 2020-09-07 (×21): qty 40.6

## 2020-09-07 MED ORDER — CEFAZOLIN SODIUM-DEXTROSE 2-4 GM/100ML-% IV SOLN
2.0000 g | Freq: Three times a day (TID) | INTRAVENOUS | Status: AC
  Administered 2020-09-07 – 2020-09-12 (×15): 2 g via INTRAVENOUS
  Filled 2020-09-07 (×16): qty 100
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
## 2020-09-07 NOTE — Progress Notes (Signed)
Subjective: Patient extubated yesterday.  Objective: Vital signs in last 24 hours: Temp:  [98.5 F (36.9 C)-99.5 F (37.5 C)] 98.7 F (37.1 C) (04/20 0800) Pulse Rate:  [72-129] 84 (04/20 0800) Resp:  [0-36] 35 (04/20 0800) BP: (117-154)/(72-95) 142/74 (04/20 0800) SpO2:  [95 %-100 %] 100 % (04/20 0800) Weight:  [70.4 kg] 70.4 kg (04/20 0410)  Intake/Output from previous day: 04/19 0701 - 04/20 0700 In: 1068 [I.V.:82.2; NG/GT:740; IV Piggyback:245.7] Out: 2750 [Urine:2750] Intake/Output this shift: Total I/O In: 365 [NG/GT:65; IV Piggyback:300] Out: -   Eyes closed.  Dysphonic.  FC x 4.  Oriented to name, hospital, year.  DHT in place.  Lab Results: Recent Labs    09/06/20 0632 09/07/20 0659  WBC 17.9* 15.4*  HGB 10.3* 11.2*  HCT 31.9* 34.2*  PLT 328 384   BMET Recent Labs    09/06/20 0632 09/07/20 0659  NA 148* 146*  K 3.4* 3.3*  CL 113* 110  CO2 28 28  GLUCOSE 167* 155*  BUN 29* 22*  CREATININE 0.74 0.67  CALCIUM 8.6* 9.0    Studies/Results: DG CHEST PORT 1 VIEW  Result Date: 09/06/2020 CLINICAL DATA:  Respiratory failure, ETT EXAM: PORTABLE CHEST 1 VIEW COMPARISON:  Radiograph 09/02/2020 FINDINGS: Endotracheal tube tip terminates 6.5 cm from the carina. Transesophageal temperature probe terminates in the upper thoracic esophagus. Transesophageal feeding tube tip terminates below the margins of imaging, beyond the GE junction. Telemetry leads overlie the chest. Persistent right perihilar and medial basilar opacity, similar to prior accounting for differences in technique. No pneumothorax or visible effusion. The cardiomediastinal contours are unremarkable. No acute osseous or soft tissue abnormality. IMPRESSION: 1. Endotracheal tube tip terminates 6.5 cm from the carina. Transesophageal tube tip beyond the GE junction. 2. Persistent right perihilar and medial basilar opacity. Electronically Signed   By: Kreg Shropshire M.D.   On: 09/06/2020 06:15     Assessment/Plan: 35 yo M with severe TBI - swallow study this am - possible downgrade this pm, but will need daily Na+ checks as he still has some risk of SIADH - on atbx for PNA   Allen [name] 09/07/2020, 10:27 AM

## 2020-09-07 NOTE — Progress Notes (Signed)
Pharmacy Antibiotic Note  Allen [name] is a 35 y.o. male admitted on 08/26/2020 with pneumonia.  Pharmacy has been consulted to de-escalate abx from vancomycin to cefazolin to complete 7 day course for MSSA pneumonia.    WBC 17.9 > 15.4. AF, SCr wnl   Plan: Stop vancomycin and start cefazolin 2 gm IV Q 8 hours  Finish 7 day total course and monitor clinical progress   Height: 5\' 10"  (177.8 cm) Weight: 70.4 kg (155 lb 3.3 oz) IBW/kg (Calculated) : 73  Temp (24hrs), Avg:98.8 F (37.1 C), Min:98.5 F (36.9 C), Max:99.5 F (37.5 C)  Recent Labs  Lab 09/03/20 0505 09/04/20 0225 09/05/20 0445 09/06/20 0632 09/07/20 0659  WBC 12.5* 13.0* 24.0* 17.9* 15.4*  CREATININE 0.79 0.74 0.71 0.74 0.67    Estimated Creatinine Clearance: 127.1 mL/min (by C-G formula based on SCr of 0.67 mg/dL).    Allergies  Allergen Reactions  . Other Other (See Comments)    Seasonal allergies- Runny nose, itchy eyes, sneezing    Antimicrobials this admission: Vancomycin 4/18 >> 4/20 Cefazolin 4/20 >>   Dose adjustments this admission:  Microbiology results: 4/15 CSF: ngtd 4/17 ET sputum: abundant MSSA   4/8 MRSA PCR neg  Thank you for allowing pharmacy to be a part of this patient's care.  6/8, PharmD., BCPS, BCCCP Clinical Pharmacist Please refer to [facility] for unit-specific pharmacist

## 2020-09-07 NOTE — Progress Notes (Signed)
Pt arrived to unit. VS stable. Bed in lowest position and call bell in reach. Pt resting comfortably. Family notified of transfer.

## 2020-09-07 NOTE — Progress Notes (Signed)
Modified Barium Swallow Progress Note  Patient Details  Name: Allen [name] MRN: [id number] Date of Birth: 31-Mar-1986  Today's Date: 09/07/2020  Modified Barium Swallow completed.  Full report located under Chart Review in the Imaging Section.  Brief recommendations include the following:  Clinical Impression  Pt presents with a moderate oropharyngeal dysphagia impacted by pts cognitive deficits from sustained TBI. Pt required increased cueing for improving mentation during MBSS. Oral deficits included reduced bolus cohesion, delayed AP transit, and oral holding with purees ultimately requiring extraction of puree from oral cavity due to no attempts to propel to pharynx. Pt with delay in swallow initation with thin liquids and nectar thick consistencies to the level of the pyriform sinsus. Thin liquids via straw sip resulted in poor timing and efficiency of laryngeal vestibule closure and glottic closure with pre and during the swallow silent laryngeal penetration and tracheal aspiration. Cues for pt to cough expelled some tracheal aspirates and penetrates however pt unable to consistently implement strateiges with cognitive function. Pt with decreased laryngeal elevation and reduced UES opening allowing for mild pyriform sinus residuals. Given deficits noted on MBSS and reduced sustained attention needed for POs, recommend continue NPO with ice chips following oral care. Anticipate good prognosis with more time for diet initiation. SLP to closely monitor.   Swallow Evaluation Recommendations       SLP Diet Recommendations: NPO;Ice chips PRN after oral care       Medication Administration: Via alternative means               Oral Care Recommendations: Oral care QID;Oral care before and after PO        Nakeyia Menden E Danh Bayus 09/07/2020,10:31 AM
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
## 2020-09-08 DIAGNOSIS — S062X3S Diffuse traumatic brain injury with loss of consciousness of 1 hour to 5 hours 59 minutes, sequela: Secondary | ICD-10-CM

## 2020-09-08 LAB — GLUCOSE, CAPILLARY
Glucose-Capillary: 172 mg/dL — ABNORMAL HIGH (ref 70–99)
Glucose-Capillary: 140 mg/dL — ABNORMAL HIGH (ref 70–99)

## 2020-09-08 MED ORDER — PIVOT 1.5 CAL PO LIQD
1000.0000 mL | ORAL | Status: DC
  Filled 2020-09-08: qty 1000

## 2020-09-08 MED ORDER — PIVOT 1.5 CAL PO LIQD
1190.0000 mL | ORAL | Status: DC
  Administered 2020-09-08 – 2020-09-11 (×4): 1190 mL
  Filled 2020-09-08 (×4): qty 2000

## 2020-09-08 MED ORDER — ENSURE ENLIVE PO LIQD
237.0000 mL | Freq: Two times a day (BID) | ORAL | Status: DC
  Administered 2020-09-10 – 2020-09-12 (×5): 237 mL via ORAL

## 2020-09-08 MED ORDER — PROSOURCE TF PO LIQD
45.0000 mL | Freq: Two times a day (BID) | ORAL | Status: DC
  Administered 2020-09-08 – 2020-09-12 (×8): 45 mL
  Filled 2020-09-08 (×8): qty 45
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
## 2020-09-08 NOTE — Progress Notes (Signed)
Rehab Admissions Coordinator Note:  Patient was screened by Clois Dupes for appropriateness for an Inpatient Acute Rehab Consult per therapy recs.   At this time, we are recommending Inpatient Rehab consult. I will place order per protocol.  Clois Dupes RN MSN 09/08/2020, 9:27 AM  I can be reached at [phone].
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
## 2020-09-08 NOTE — Progress Notes (Addendum)
Inpatient Rehab Admissions:  Inpatient Rehab Consult received.  I met with patient at the bedside for rehabilitation assessment and to discuss goals and expectations of an inpatient rehab admission.  Wife not at bedside during my visit, but I left her a message to discuss CIR.  Pt agreeable to CIR.  He does follow commands and answers questions seemingly appropriately, with a delay.  Per trauma discussion with pt's spouse family is able to provide 24/7 assist at discharge.  Will wait to confirm with Deidra and review cost of rehab with her.  Will continue to follow.   Addendum 1539: I was able to speak with pt's wife, Deidra over the phone.  She and family have discussed CIR and are hopeful for potential admission.  They are prepared to provide 24/7 supervision at discharge and I reviewed ELOS (18-24 days) and goals of supervision for mobility/ADLs, and min assist for cognition.  Deidra states that patient does have health insurance through [employer] and I am working on verifying if there are any benefits we can use for CIR, as well as whether or not prior Josem Kaufmann is required.  Will provide updates as available.  Pt also has medicaid application pending.  Will continue to follow for possible admission pending bed availability and insurance.   Signed: Shann Medal, PT, DPT Admissions Coordinator [phone] 09/08/20  3:12 PM
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
## 2020-09-09 LAB — GLUCOSE, CAPILLARY
Glucose-Capillary: 114 mg/dL — ABNORMAL HIGH (ref 70–99)
Glucose-Capillary: 119 mg/dL — ABNORMAL HIGH (ref 70–99)
Glucose-Capillary: 126 mg/dL — ABNORMAL HIGH (ref 70–99)
Glucose-Capillary: 136 mg/dL — ABNORMAL HIGH (ref 70–99)
Glucose-Capillary: 144 mg/dL — ABNORMAL HIGH (ref 70–99)

## 2020-09-09 MED ORDER — RESOURCE THICKENUP CLEAR PO POWD
ORAL | Status: DC | PRN
  Filled 2020-09-09: qty 125
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
## 2020-09-09 NOTE — Progress Notes (Signed)
SLP Cancellation Note  Patient Details Name: Allen [name] MRN: [id number] DOB: April 03, 1986   Cancelled treatment:       Reason Eval/Treat Not Completed: Other (comment) (pt is in room with Dr Lindie Spruce and RN, will continue efforts; RN reports tolerating po but not wanting to consume much - he remains on nocturnal feeds)   Chales Abrahams 09/09/2020, 10:54 AM  Rolena Infante, MS [facility] SLP Acute Rehab Services Office [phone] Pager [phone]

## 2020-09-09 NOTE — Plan of Care (Signed)
  Problem: Safety: Goal: Non-violent Restraint(s) Outcome: Progressing   Problem: Education: Goal: Knowledge of General Education information will improve Description: Including pain rating scale, medication(s)/side effects and non-pharmacologic comfort measures Outcome: Progressing   Problem: Health Behavior/Discharge Planning: Goal: Ability to manage health-related needs will improve Outcome: Progressing   Problem: Clinical Measurements: Goal: Will remain free from infection Outcome: Progressing Goal: Diagnostic test results will improve Outcome: Progressing Goal: Respiratory complications will improve Outcome: Progressing
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
## 2020-09-09 NOTE — Progress Notes (Signed)
Subjective: NAEs o/n  Objective: Vital signs in last 24 hours: Temp:  [98.5 F (36.9 C)-99.1 F (37.3 C)] 98.9 F (37.2 C) (04/22 1452) Pulse Rate:  [88-114] 98 (04/22 1452) Resp:  [13-24] 13 (04/22 1452) BP: (130-153)/(77-96) 130/82 (04/22 1452) SpO2:  [96 %-100 %] 98 % (04/22 1452) Weight:  [70.4 kg] 70.4 kg (04/22 0500)  Intake/Output from previous day: 04/21 0701 - 04/22 0700 In: 80 [P.O.:80] Out: 1250 [Urine:1250] Intake/Output this shift: No intake/output data recorded.  Eyes open to voice FC x 4 Oriented to person  Lab Results: Recent Labs    09/07/20 0659  WBC 15.4*  HGB 11.2*  HCT 34.2*  PLT 384   BMET Recent Labs    09/07/20 0659  NA 146*  K 3.3*  CL 110  CO2 28  GLUCOSE 155*  BUN 22*  CREATININE 0.67  CALCIUM 9.0    Studies/Results: No results found.  Assessment/Plan: Severe TBI - cont supportive therapies   Bedelia Person 09/09/2020, 7:13 PM
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
## 2020-09-09 NOTE — Progress Notes (Signed)
Inpatient Rehab Admissions Coordinator:  Called pt's wife to to discuss benefits regarding CIR.   Have not been able to get in contact with her. Will f/u at later date.   Wolfgang Phoenix, MS, CCC-SLP Admissions Coordinator [phone]

## 2020-09-10 LAB — GLUCOSE, CAPILLARY
Glucose-Capillary: 124 mg/dL — ABNORMAL HIGH (ref 70–99)
Glucose-Capillary: 115 mg/dL — ABNORMAL HIGH (ref 70–99)
Glucose-Capillary: 162 mg/dL — ABNORMAL HIGH (ref 70–99)
Glucose-Capillary: 113 mg/dL — ABNORMAL HIGH (ref 70–99)
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
## 2020-09-10 NOTE — Progress Notes (Signed)
Inpatient Rehab Admissions Coordinator:  Called pt's wife, Elenora Fender.  Left message. Awaiting return call.   Wolfgang Phoenix, MS, CCC-SLP Admissions Coordinator [phone]

## 2020-09-10 NOTE — Plan of Care (Signed)
  Problem: Safety: Goal: Non-violent Restraint(s) Outcome: Progressing   
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
## 2020-09-11 NOTE — Plan of Care (Signed)
  Problem: Safety: Goal: Non-violent Restraint(s) Outcome: Progressing   Problem: Health Behavior/Discharge Planning: Goal: Ability to manage health-related needs will improve Outcome: Progressing   Problem: Clinical Measurements: Goal: Ability to maintain clinical measurements within normal limits will improve Outcome: Progressing Goal: Will remain free from infection Outcome: Progressing

## 2020-09-11 NOTE — Progress Notes (Signed)
Non Violent Restraints expired. RN tried to DC restraints but pt keep turning in the bed, Pulling IV lines and Condom Cath. Oncall informed. Waiting for new orders. Will continue to monitor.

## 2020-09-11 NOTE — Progress Notes (Signed)
Staples x 2 removed from scalp.  Area clean, dry, intact with no signs of infection.

## 2020-09-12 ENCOUNTER — Inpatient Hospital Stay (HOSPITAL_COMMUNITY)
Admission: RE | Admit: 2020-09-12 | Discharge: 2020-09-20 | DRG: 945 | Disposition: A | Discharge status: Home / Self Care | Payer: Medicaid Other | Source: Intra-hospital | Attending: Physical Medicine & Rehabilitation | Admitting: Physical Medicine & Rehabilitation

## 2020-09-12 ENCOUNTER — Inpatient Hospital Stay (HOSPITAL_COMMUNITY): Payer: Medicaid Other

## 2020-09-12 ENCOUNTER — Encounter (HOSPITAL_COMMUNITY): Payer: Self-pay | Admitting: Physical Medicine & Rehabilitation

## 2020-09-12 DIAGNOSIS — S066X0D Traumatic subarachnoid hemorrhage without loss of consciousness, subsequent encounter: Secondary | ICD-10-CM | POA: Diagnosis present

## 2020-09-12 DIAGNOSIS — R4189 Other symptoms and signs involving cognitive functions and awareness: Secondary | ICD-10-CM | POA: Diagnosis present

## 2020-09-12 DIAGNOSIS — X838XXD Intentional self-harm by other specified means, subsequent encounter: Secondary | ICD-10-CM | POA: Diagnosis not present

## 2020-09-12 DIAGNOSIS — H5461 Unqualified visual loss, right eye, normal vision left eye: Secondary | ICD-10-CM | POA: Diagnosis present

## 2020-09-12 DIAGNOSIS — S069X9A Unspecified intracranial injury with loss of consciousness of unspecified duration, initial encounter: Secondary | ICD-10-CM | POA: Diagnosis present

## 2020-09-12 DIAGNOSIS — K5900 Constipation, unspecified: Secondary | ICD-10-CM | POA: Diagnosis present

## 2020-09-12 DIAGNOSIS — R000 Tachycardia, unspecified: Secondary | ICD-10-CM | POA: Diagnosis not present

## 2020-09-12 DIAGNOSIS — G8918 Other acute postprocedural pain: Secondary | ICD-10-CM | POA: Diagnosis present

## 2020-09-12 DIAGNOSIS — J189 Pneumonia, unspecified organism: Secondary | ICD-10-CM | POA: Diagnosis present

## 2020-09-12 DIAGNOSIS — R1312 Dysphagia, oropharyngeal phase: Secondary | ICD-10-CM | POA: Diagnosis present

## 2020-09-12 DIAGNOSIS — I609 Nontraumatic subarachnoid hemorrhage, unspecified: Secondary | ICD-10-CM

## 2020-09-12 DIAGNOSIS — Z7151 Drug abuse counseling and surveillance of drug abuser: Secondary | ICD-10-CM

## 2020-09-12 DIAGNOSIS — S069X0A Unspecified intracranial injury without loss of consciousness, initial encounter: Secondary | ICD-10-CM

## 2020-09-12 DIAGNOSIS — R451 Restlessness and agitation: Secondary | ICD-10-CM | POA: Diagnosis not present

## 2020-09-12 DIAGNOSIS — R269 Unspecified abnormalities of gait and mobility: Secondary | ICD-10-CM | POA: Diagnosis present

## 2020-09-12 DIAGNOSIS — S065X9D Traumatic subdural hemorrhage with loss of consciousness of unspecified duration, subsequent encounter: Secondary | ICD-10-CM

## 2020-09-12 DIAGNOSIS — S069XAA Unspecified intracranial injury with loss of consciousness status unknown, initial encounter: Secondary | ICD-10-CM | POA: Diagnosis present

## 2020-09-12 LAB — CBC
HCT: 36.7 % — ABNORMAL LOW (ref 39.0–52.0)
Hemoglobin: 12.2 g/dL — ABNORMAL LOW (ref 13.0–17.0)
MCH: 30.9 pg (ref 26.0–34.0)
MCHC: 33.2 g/dL (ref 30.0–36.0)
MCV: 92.9 fL (ref 80.0–100.0)
Platelets: 660 10*3/uL — ABNORMAL HIGH (ref 150–400)
RBC: 3.95 MIL/uL — ABNORMAL LOW (ref 4.22–5.81)
RDW: 12.4 % (ref 11.5–15.5)
WBC: 9.7 10*3/uL (ref 4.0–10.5)
nRBC: 0 % (ref 0.0–0.2)

## 2020-09-12 LAB — CREATININE, SERUM
Creatinine, Ser: 0.8 mg/dL (ref 0.61–1.24)
GFR, Estimated: >60 mL/min (ref >60)

## 2020-09-12 MED ORDER — HEPARIN SODIUM (PORCINE) 5000 UNIT/ML IJ SOLN: 5000.0000 [IU] | Freq: Three times a day (TID) | INTRAMUSCULAR | Status: DC

## 2020-09-12 MED ORDER — OXYCODONE HCL 5 MG/5ML PO SOLN
5.0000 mg | Freq: Four times a day (QID) | ORAL | Status: DC | PRN
  Administered 2020-09-12: 10 mg via ORAL
  Filled 2020-09-12: qty 10

## 2020-09-12 MED ORDER — HALOPERIDOL LACTATE 5 MG/ML IJ SOLN
5.0000 mg | Freq: Four times a day (QID) | INTRAMUSCULAR | Status: DC | PRN
  Administered 2020-09-12: 5 mg via INTRAMUSCULAR
  Filled 2020-09-12: qty 1

## 2020-09-12 MED ORDER — RESOURCE THICKENUP CLEAR PO POWD
ORAL | Status: DC | PRN
  Filled 2020-09-12: qty 125

## 2020-09-12 MED ORDER — OXYCODONE HCL 5 MG/5ML PO SOLN: 5.0000 mg | Freq: Four times a day (QID) | ORAL | Status: DC | PRN

## 2020-09-12 MED ORDER — ONDANSETRON 4 MG PO TBDP
4.0000 mg | ORAL_TABLET | Freq: Four times a day (QID) | ORAL | Status: DC | PRN
  Administered 2020-09-12: 4 mg via ORAL
  Filled 2020-09-12: qty 1

## 2020-09-12 MED ORDER — FENTANYL CITRATE (PF) 100 MCG/2ML IJ SOLN: 50.0000 ug | Freq: Three times a day (TID) | INTRAMUSCULAR | Status: DC | PRN

## 2020-09-12 MED ORDER — ENSURE ENLIVE PO LIQD
237.0000 mL | Freq: Two times a day (BID) | ORAL | Status: DC
  Administered 2020-09-13 – 2020-09-20 (×7): 237 mL via ORAL

## 2020-09-12 MED ORDER — METHOCARBAMOL 500 MG PO TABS
1000.0000 mg | ORAL_TABLET | Freq: Three times a day (TID) | ORAL | Status: DC
  Administered 2020-09-12 – 2020-09-19 (×18): 1000 mg via ORAL
  Filled 2020-09-12 (×21): qty 2

## 2020-09-12 MED ORDER — ACETAMINOPHEN 325 MG PO TABS: 325.0000 mg | ORAL_TABLET | ORAL | Status: DC | PRN

## 2020-09-12 MED ORDER — ONDANSETRON HCL 4 MG/2ML IJ SOLN: 4.0000 mg | Freq: Four times a day (QID) | INTRAMUSCULAR | Status: DC | PRN

## 2020-09-12 MED ORDER — HEPARIN SODIUM (PORCINE) 5000 UNIT/ML IJ SOLN
5000.0000 [IU] | Freq: Three times a day (TID) | INTRAMUSCULAR | Status: DC
  Administered 2020-09-12 – 2020-09-19 (×20): 5000 [IU] via SUBCUTANEOUS
  Filled 2020-09-12 (×20): qty 1

## 2020-09-12 MED ORDER — DOCUSATE SODIUM 50 MG/5ML PO LIQD
100.0000 mg | Freq: Two times a day (BID) | ORAL | Status: DC
  Administered 2020-09-12 – 2020-09-16 (×8): 100 mg via ORAL
  Filled 2020-09-12 (×8): qty 10

## 2020-09-12 MED ORDER — QUETIAPINE FUMARATE 50 MG PO TABS
50.0000 mg | ORAL_TABLET | Freq: Two times a day (BID) | ORAL | Status: DC
  Administered 2020-09-12 – 2020-09-16 (×8): 50 mg via ORAL
  Filled 2020-09-12 (×8): qty 1

## 2020-09-12 NOTE — Progress Notes (Signed)
Inpatient Rehabilitation Medication Review by a Pharmacist  A complete drug regimen review was completed for this patient to identify any potential clinically significant medication issues.  Clinically significant medication issues were identified:  no  Check AMION for pharmacist assigned to patient if future medication questions/issues arise during this admission.  Pharmacist comments: No issues identified.  Time spent performing this drug regimen review (minutes):  10   Aldina Porta, Judie Bonus 09/12/2020 6:25 PM

## 2020-09-12 NOTE — Discharge Instructions (Signed)
Living With Traumatic Brain Injury Traumatic brain injury (TBI) is an injury to the brain that may be mild, moderate, or severe. Symptoms of any type of TBI can be long lasting (chronic). Depending on the area of the brain that is affected, a TBI can interfere with vision, memory, concentration, reasoning, speech, balance, sense of touch, and sleep. TBI can also cause chronic symptoms, such as headache or dizziness. You can take steps to help with recovery and make it easier to live with this condition. How to manage lifestyle changes After a TBI, you may need to make changes to your lifestyle in order to recover as well as possible. How quickly and how fully you recover will depend on the severity of your injury. Following a rehabilitation plan You will likely work with specialists to develop a rehabilitation plan to help you return to your regular activities. Your health care team may include:  Physical or occupational therapists.  Speech and language pathologists.  Mental health counselors.  Physicians, such as your primary care physician or neurologist. Managing changes in activity Follow instructions from your health care provider about any activities you need to limit or avoid while you recover. This may include:  Taking time off work or school, depending on your injury.  Avoiding activities where there is a risk for another head injury, such as football, hockey, soccer, basketball, martial arts, downhill snow sports, and horseback riding. Do not do these activities until your health care provider approves.  Avoiding driving. Your ability to drive safely may be affected by your injury. ? Rely on family, friends, or a transportation service to help you get around and to appointments. ? Have a professional evaluation to check your driving ability. ? Access support services to help you return to driving. These may include training and adaptive equipment.   General instructions  Keep a  consistent daily routine.  Rest helps the brain to heal. Make sure you: ? Get plenty of sleep at night. Avoid staying up late. ? Keep the same bedtime hours on weekends and weekdays. ? Rest during the day. Take daytime naps or rest breaks when you feel tired.  Avoid extra stress on your eyes. You may need to set time limits when working on the computer, watching TV, and reading.  Make lists, set reminders, or use a day planner to help your memory.  Allow yourself plenty of time to complete everyday tasks, such as grocery shopping, paying bills, and doing laundry.  Focus on one task at a time. How to recognize and manage stress Certain activities and situations may be more challenging to handle after a brain injury. This may cause you to feel stressed. Here are some signs that you may be feeling stressed:  Feeling irritable, angry, or frustrated.  Feeling worried or anxious.  Avoiding contact with other people.  Having a fast heart rate. Finding healthy ways to manage stress can help make a difficult situation easier to handle. This may include:  Avoiding activities that cause stress.  Deep breathing, yoga, or meditation.  Listening to music or spending time outdoors. Follow these instructions at home:  Take over-the-counter and prescription medicines only as told by your health care provider. Do not take aspirin or other anti-inflammatory medicines, such as ibuprofen or naproxen, unless your health care provider approves.  Avoid large amounts of caffeine. Your body may be more sensitive to it after your injury.  Do not use any products that contain nicotine or tobacco, such as cigarettes,  e-cigarettes, and chewing tobacco. If you need help quitting, ask your health care provider.  Do not use drugs.  Do not use alcohol until your health care provider approves. Your body may be more sensitive to it after your injury. Alcohol may slow your recovery.  Do not drive until your  health care provider says that it is safe.  Keep all follow-up visits as told by your health care provider. This is important. Where to find support  Talk with your employer, co-workers, teachers, or school counselor about your injury. Work together to develop a plan for completing tasks while you recover.  Talk to others living with a TBI. Join a support group with other people who have experienced a TBI.  Let your friends and family members know what they can do to help. This might include helping at home or with transportation to appointments.  If you are unable to continue working after your injury, talk to a Child psychotherapist about options to help you meet your financial needs.  Find additional information and support by contacting the National Brain Injury Information Center [facility] at 2627947874.  If you are a Community education officer, seek out additional resources, such as: ? Sports coach and [employer] Traumatic Brain Injury Center of Excellence: BeverageBargains.co.za ? Department of [employer]: 878 299 1381. Contact a health care provider if:  You have new or worsening: ? Dizziness. ? Headache. ? Anxiety or depression. ? Irritability. ? Confusion. ? Extreme sensitivity to light or sound. ? Nausea or vomiting. Get help right away if:  You have jerky movements that you cannot control (seizures). Summary  A traumatic brain injury (TBI) is an injury to your brain that can interfere with vision, memory, concentration, reasoning, speech, balance, sense of touch, and sleep. TBI can also cause long-lasting (chronic) symptoms, such as headache or dizziness.  After a TBI, you may need to make several changes to your lifestyle in order to recover as well as possible. How quickly and how fully you recover will depend on the severity of your injury.  Rest helps the brain to heal. Make sure you get plenty of sleep at night. Avoid staying  up late.  Talk to your family, friends, employer, co-workers, Architectural technologist, or school counselor about your injury. Work together to develop a plan for completing tasks while you recover.  Talk to others living with a TBI. Join a support group with other people who have experienced a TBI. This information is not intended to replace advice given to you by your health care provider. Make sure you discuss any questions you have with your health care provider. Document Revised: 06/25/2019 Document Reviewed: 06/25/2019 Elsevier Patient Education  2021 [employer].
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
## 2020-09-12 NOTE — Progress Notes (Signed)
Inpatient Rehab Admissions Coordinator:   I have a bed for this patient to admit to CIR today.  Trauma team in agreement.  Met with pt and spouse at bedside to review cost of CIR (the insurance plan they have does not have hospital benefits) and what to expect as far as billing from CIR with pending Medicaid application.  Spouse verbalizes agreement.  Also discussed with trauma and plan to remove cortrak today to see if pt will eat more without it.  He will also be able to be in a veil bed if cortrak is removed.  Will plan admit today.  TOC aware.   Shann Medal, PT, DPT Admissions Coordinator [phone] 09/12/20  11:31 AM

## 2020-09-13 DIAGNOSIS — S069X3S Unspecified intracranial injury with loss of consciousness of 1 hour to 5 hours 59 minutes, sequela: Secondary | ICD-10-CM

## 2020-09-13 DIAGNOSIS — R7401 Elevation of levels of liver transaminase levels: Secondary | ICD-10-CM

## 2020-09-13 DIAGNOSIS — R4689 Other symptoms and signs involving appearance and behavior: Secondary | ICD-10-CM

## 2020-09-13 DIAGNOSIS — S069X0S Unspecified intracranial injury without loss of consciousness, sequela: Secondary | ICD-10-CM

## 2020-09-13 DIAGNOSIS — R1312 Dysphagia, oropharyngeal phase: Secondary | ICD-10-CM

## 2020-09-13 LAB — CBC WITH DIFFERENTIAL/PLATELET
Abs Immature Granulocytes: 0.17 10*3/uL — ABNORMAL HIGH (ref 0.00–0.07)
Basophils Absolute: 0.1 10*3/uL (ref 0.0–0.1)
Basophils Relative: 1 %
Eosinophils Absolute: 0.1 10*3/uL (ref 0.0–0.5)
Eosinophils Relative: 2 %
HCT: 38.5 % — ABNORMAL LOW (ref 39.0–52.0)
Hemoglobin: 12.6 g/dL — ABNORMAL LOW (ref 13.0–17.0)
Immature Granulocytes: 2 %
Lymphocytes Relative: 26 %
Lymphs Abs: 2 10*3/uL (ref 0.7–4.0)
MCH: 30.5 pg (ref 26.0–34.0)
MCHC: 32.7 g/dL (ref 30.0–36.0)
MCV: 93.2 fL (ref 80.0–100.0)
Monocytes Absolute: 0.6 10*3/uL (ref 0.1–1.0)
Monocytes Relative: 8 %
Neutro Abs: 4.7 10*3/uL (ref 1.7–7.7)
Neutrophils Relative %: 61 %
Platelets: 666 10*3/uL — ABNORMAL HIGH (ref 150–400)
RBC: 4.13 MIL/uL — ABNORMAL LOW (ref 4.22–5.81)
RDW: 12.5 % (ref 11.5–15.5)
WBC: 7.7 10*3/uL (ref 4.0–10.5)
nRBC: 0 % (ref 0.0–0.2)

## 2020-09-13 LAB — COMPREHENSIVE METABOLIC PANEL
ALT: 294 U/L — ABNORMAL HIGH (ref 0–44)
AST: 117 U/L — ABNORMAL HIGH (ref 15–41)
Albumin: 3.4 g/dL — ABNORMAL LOW (ref 3.5–5.0)
Alkaline Phosphatase: 143 U/L — ABNORMAL HIGH (ref 38–126)
Anion gap: 9 (ref 5–15)
BUN: 21 mg/dL — ABNORMAL HIGH (ref 6–20)
CO2: 30 mmol/L (ref 22–32)
Calcium: 9.8 mg/dL (ref 8.9–10.3)
Chloride: 97 mmol/L — ABNORMAL LOW (ref 98–111)
Creatinine, Ser: 0.73 mg/dL (ref 0.61–1.24)
GFR, Estimated: >60 mL/min (ref >60)
Glucose, Bld: 109 mg/dL — ABNORMAL HIGH (ref 70–99)
Potassium: 4.3 mmol/L (ref 3.5–5.1)
Sodium: 136 mmol/L (ref 135–145)
Total Bilirubin: 0.6 mg/dL (ref 0.3–1.2)
Total Protein: 7.6 g/dL (ref 6.5–8.1)

## 2020-09-13 MED ORDER — DIVALPROEX SODIUM 500 MG PO DR TAB
500.0000 mg | DELAYED_RELEASE_TABLET | Freq: Two times a day (BID) | ORAL | Status: DC
  Administered 2020-09-13 – 2020-09-16 (×6): 500 mg via ORAL
  Filled 2020-09-13 (×6): qty 1
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
## 2020-09-13 NOTE — Progress Notes (Signed)
Patient information reviewed and entered into eRehab System by Becky Brittin Belnap, PPS coordinator. Information including medical coding, function ability, and quality indicators will be reviewed and updated through discharge.   
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
## 2020-09-13 NOTE — Progress Notes (Signed)
Patient ID: SAID RUEB, male   DOB: 03/12/1986, 35 y.o.   MRN: [id number]  [id number]- SW left message for pt wife Deidra [phone]) and waiting on follow-up.   Cecile Sheerer, MSW, LCSWA Office: [phone] Cell: [phone] Fax: [phone]
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
## 2020-09-13 NOTE — Progress Notes (Signed)
   09/12/20 2030  What Happened  Was fall witnessed? No  Was patient injured? Unsure  Patient found on floor  Found by Staff-comment (Mumtaz Lovins B., RN; Antony Contras., NT; Cordie Grice., NT)  Stated prior activity other (comment) (Pt in enclosure bed; reports breaking zipper to exit bed.)  Follow Up  MD notified Malissa Hippo., PA  Time MD notified 2035  Family notified No - patient refusal (Offered to call wife)  Time family notified  (n/a)  Additional tests Yes-comment  Simple treatment  (Head CT)  Progress note created (see row info) Yes  Adult Fall Risk Assessment  Risk Factor Category (scoring not indicated) High fall risk per protocol (document High fall risk);History of more than one fall within 6 months before admission (document High fall risk)  Patient Fall Risk Level High fall risk  Adult Fall Risk Interventions  Required Bundle Interventions *See Row Information* High fall risk - low, moderate, and high requirements implemented  Additional Interventions Use of appropriate toileting equipment (bedpan, BSC, etc.);Room near nurses station;Reorient/diversional activities with confused patients  Screening for Fall Injury Risk (To be completed on HIGH fall risk patients) - Assessing Need for Floor Mats  Risk For Fall Injury- Criteria for Floor Mats Noncompliant with safety precautions;Confusion/dementia (+NuDESC, CIWA, TBI, etc.)  Will Implement Floor Mats Yes
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
## 2020-09-14 ENCOUNTER — Other Ambulatory Visit: Payer: Self-pay

## 2020-09-14 MED ORDER — QUETIAPINE FUMARATE 50 MG PO TABS
50.0000 mg | ORAL_TABLET | Freq: Every evening | ORAL | Status: DC | PRN
  Administered 2020-09-14: 50 mg via ORAL
  Filled 2020-09-14 (×2): qty 1

## 2020-09-14 NOTE — Progress Notes (Signed)
Inpatient Rehabilitation Care Coordinator Assessment and Plan Patient Details  Name: Allen [name] MRN: [id number] Date of Birth: 1986/01/26  Today's Date: 09/14/2020  Hospital Problems: Principal Problem:   TBI (traumatic brain injury) [facility])  Past Medical History: History reviewed. No pertinent past medical history. Past Surgical History: History reviewed. No pertinent surgical history. Social History:  reports that he has been smoking cigarettes. He has been smoking about 0.25 packs per day. He has never used smokeless tobacco. He reports current alcohol use of about 14.0 standard drinks of alcohol per week. He reports current drug use. Frequency: 8.00 times per week. Drug: Marijuana.  Family / Support Systems Marital Status: Married Spouse/Significant Other: Allen [name] (wife): [phone] Children: 2 y.o. 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 Education: high school grad Read: Yes Write: Yes Employment Status: Employed Return to Work Plans: TBD Public relations account executive Issues: Denies Insurance account manager: N/A   Abuse/Neglect Abuse/Neglect Assessment Can Be Completed:  (Pt is a brain injury) Physical Abuse: Denies Verbal Abuse: Denies Sexual Abuse: Denies Exploitation of patient/patient's resources: Denies Self-Neglect: Denies  Emotional Status Pt's affect, behavior and adjustment status: Pt appeared to be in good spirits at time of visit Recent Psychosocial Issues: Denies Psychiatric History: Denies Substance Abuse History: Pt admits to a beer and hennessy once a week  Patient / Family Perceptions, Expectations & Goals Pt/Family understanding of illness & functional  limitations: N/A Premorbid pt/family roles/activities: Independent Anticipated changes in roles/activities/participation: Assistance with ADLs/IADLs Pt/family expectations/goals: Pt goal is to "get out of here."  [employer]: None Premorbid Home Care/DME Agencies: None Transportation available at discharge: TBD Resource referrals recommended: Neuropsychology  Discharge Planning Living Arrangements: Spouse/significant other,Children Support Systems: Spouse/significant other,Children Type of Residence: Private residence Insurance Resources: Teacher, adult education Screen Referred: No Living Expenses: Rent Money Management: Spouse,Patient Does the patient have any problems obtaining your medications?: No Care Coordinator Barriers to Discharge: Decreased caregiver support,Lack of/limited family support Care Coordinator Anticipated Follow Up Needs: HH/OP  Clinical Impression SW met with pt in room to introduce self, explain role, and discuss discharge process. Unsure on how much information is accurate when completing assessment due to pt injury. No DME. Not a veteran. No HCPOA.   Allen [name] 09/14/2020, 3:54 PM

## 2020-09-14 NOTE — Progress Notes (Signed)
Patient ID: Allen [name], male   DOB: [birth year], 35 y.o.   MRN: [id number]   SW left message for pt wife Deidra [phone]) and waiting on follow-up.   Cecile Sheerer, MSW, LCSWA Office: [phone] Cell: [phone] Fax: [phone]

## 2020-09-14 NOTE — Progress Notes (Signed)
Physical Therapy Note  Patient Details  Name: Allen [name] MRN: [id number] Date of Birth: 11/30/1985 Today's Date: 09/14/2020    Patient asleep in the secured enclosure bed upon PT arrival. Patient responded x1 to verbal stimulation, however, immediately he fell back asleep with poor response. Discussed with nursing, who reports the patient did not sleep much last night and received Seroquel this morning. Patient missed 60 min of skilled PT due to fatigue/lethargy, RN made aware. Will attempt to make-up missed time as able.     Olie Dibert L Fenton Candee PT, DPT  09/14/2020, 12:38 PM

## 2020-09-14 NOTE — Progress Notes (Signed)
Speech Language Pathology TBI Note  Patient Details  Name: Allen [name] MRN: [id number] Date of Birth: 1985-10-20  Today's Date: 09/14/2020 SLP Individual Time: 2409-7353 SLP Individual Time Calculation (min): 40 min  Short Term Goals: Week 1: SLP Short Term Goal 1 (Week 1): Patient will sustain attention to functional tasks for 15 minutes with Mod A verbal cues for redirection. SLP Short Term Goal 2 (Week 1): Patient will deomstrate functional problem solving for basic and familiar tasks with Mod A verbal cues. SLP Short Term Goal 3 (Week 1): Patient will self-monitor and correct errors with Mod A verbal cues to self-monitor an correct errors during functional tasks. SLP Short Term Goal 4 (Week 1): Patient will recall new, daily information with Mod A verbal cues use of external memory aids.  Skilled Therapeutic Interventions: Skilled treatment session focused on dysphagia and cognitive goals. Upon arrival, patient was awake in bed and appeared verbally frustrated. Patient asking to get out of bed so he can go to the store. SLP provided education rationale as to why patient was unable to go to the store at this time but patient remained perseverative on topic throughout session. Patient's wife present and attempted to redirect patient as well with minimal success. Patient was transferred to the wheelchair with his breakfast meal of Dys. 3 textures with nectar-thick liquids. Patient with minimal PO intake and declined all food including food brought from home despite max encouragement. SLP educated on need for repeat MBS, however, patient declined and reported he would not participate at this time. Patient eventually declining to get back into enclosure bed at end of session but with extra time and encouragement, patient transferred back. Patient left secure in enclosure bed with all needs in place. Patient's wife provided emotional support. Continue with current plan of care.      Pain Pain  Assessment Pain Scale: 0-10 Pain Score: 0-No pain  Agitated Behavior Scale: TBI Observation Details Observation Environment: Patient's room Start of observation period - Date: 09/14/20 Start of observation period - Time: 0815 End of observation period - Date: 09/14/20 End of observation period - Time: 0855 Agitated Behavior Scale (DO NOT LEAVE BLANKS) Short attention span, easy distractibility, inability to concentrate: Present to an extreme degree Impulsive, impatient, low tolerance for pain or frustration: Present to an extreme degree Uncooperative, resistant to care, demanding: Present to an extreme degree Violent and/or threatening violence toward people or property: Absent Explosive and/or unpredictable anger: Absent Rocking, rubbing, moaning, or other self-stimulating behavior: Absent Pulling at tubes, restraints, etc.: Absent Wandering from treatment areas: Present to a slight degree Restlessness, pacing, excessive movement: Present to a slight degree Repetitive behaviors, motor, and/or verbal: Present to an extreme degree Rapid, loud, or excessive talking: Absent Sudden changes of mood: Absent Easily initiated or excessive crying and/or laughter: Absent Self-abusiveness, physical and/or verbal: Absent Agitated behavior scale total score: 28  Therapy/Group: Individual Therapy  Allen [name] 09/14/2020, 10:49 AM

## 2020-09-14 NOTE — Progress Notes (Signed)
Calorie Count Note  48-hour calorie count ordered. Please see day 1 and day 2 results below.  Diet: dysphagia 3 with nectar-thick liquids Supplements:  - Ensure Enlive BID - Mighty Shake TID - Magic Cup TID  Day 1: 4/25 Dinner: 830 kcal, 20 grams of protein 4/26 Breakfast: 175 kcal, 11 grams of protein 4/26 Lunch: 75 kcal, 1 gram of protein  Day 1 total intake: 1080 kcal (49% of minimum estimated needs)  32 grams of protein (29% of minimum estimated needs)  Day 2: 4/26 Dinner: 127 kcal, 2 grams of protein 4/27 Breakfast: 0 kcal, 0 grams of protein 4/27 Lunch: 0 kcal, 0 gram of protein  Day 2 total intake: 127 kcal (6% of minimum estimated needs)  2 grams of protein (2% of minimum estimated needs)  Nutrition Diagnosis: Increased nutrient needs related to other (TBI, therapies) as evidenced by estimated needs.  Goal: Patient will meet greater than or equal to 90% of their needs  Intervention:  - Recommend enteral nutrition, will discuss with MD/PA - d/c calorie count - Continue Ensure Enlive BID - Continue Mighty Shake TID - Continue Magic Cup TID   Mertie Clause, MS, RD, LDN Inpatient Clinical Dietitian Please see AMiON for contact information.

## 2020-09-14 NOTE — Progress Notes (Signed)
Occupational Therapy Session Note  Patient Details  Name: Allen [name] MRN: [id number] Date of Birth: 04-24-1986  Today's Date: 09/14/2020 OT Individual Time: 8592-9244 OT Individual Time Calculation (min): 20 min    Short Term Goals: Week 1:  OT Short Term Goal 1 (Week 1): Pt will demo improved awareness by requiring only min cueing for impulsivity during toileting OT Short Term Goal 2 (Week 1): Pt will complete toileting tasks with CGA OT Short Term Goal 3 (Week 1): Pt will attend to grooming tasks with no more than min cueing  Skilled Therapeutic Interventions/Progress Updates:    1:1. Pt received in bed with wife present. Pt requires A to don socks and increased environmental stimuli to arouse- lights on, enclosure bed open, door open. Pt "given a hand" to get up pulling up on OT to sit EOB. Pt completes squat pivot transfer to w/c with MIN A overall. Pt pulls up to the sink to brush teeth and immediately requesting to lay back down with wife encouraging pt to commpelte grooming. OT attempted to facilitate grooming but pt unlocks w/c wheels back to EOB and impulsively stands up without locking brakes. Edu re locking w/c prior to sit to stand for safety. Attempted to engage pt in conversation around hobbies/interests however pt continues to state "sleep" and "somewhat" seemingly intentionally with attitude. Ot secures enclosure bed and discusses DC plan, CLOF, rancho levels and prior behavior and current behavior with wife. Exited session with pt seated in bed, exit alarm on and call light in reach   Therapy Documentation Precautions:  Precautions Precautions: Fall,Other (comment) Precaution Comments: impulsive Restrictions Weight Bearing Restrictions: No General:   Vital Signs:   Pain: Pain Assessment Pain Scale: 0-10 Pain Score: 0-No pain ADL: ADL Eating: Not assessed Grooming: Not assessed Upper Body Bathing: Not assessed Lower Body Bathing: Not assessed Upper Body  Dressing: Contact guard Where Assessed-Upper Body Dressing: Edge of bed Lower Body Dressing: Moderate assistance Where Assessed-Lower Body Dressing: Edge of bed Toileting: Minimal assistance Where Assessed-Toileting: Teacher, adult education: Moderate assistance Toilet Transfer Method: Ambulating Tub/Shower Transfer: Not assessed Film/video editor: Not assessed Vision   Perception    Praxis   Exercises:   Other Treatments:     Therapy/Group: Individual Therapy  Shon Hale 09/14/2020, 9:36 AM
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
## 2020-09-14 NOTE — Care Management (Signed)
Inpatient Rehabilitation Center Individual Statement of Services  Patient Name:  Allen [name]  Date:  09/14/2020  Welcome to the Inpatient Rehabilitation Center.  Our goal is to provide you with an individualized program based on your diagnosis and situation, designed to meet your specific needs.  With this comprehensive rehabilitation program, you will be expected to participate in at least 3 hours of rehabilitation therapies Monday-Friday, with modified therapy programming on the weekends.  Your rehabilitation program will include the following services:  Physical Therapy (PT), Occupational Therapy (OT), Speech Therapy (ST), 24 hour per day rehabilitation nursing, Therapeutic Recreaction (TR), Psychology, Neuropsychology, Care Coordinator, Rehabilitation Medicine, Nutrition Services, Pharmacy Services and Other  Weekly team conferences will be held on Tuesdays to discuss your progress.  Your Inpatient Rehabilitation Care Coordinator will talk with you frequently to get your input and to update you on team discussions.  Team conferences with you and your family in attendance may also be held.  Expected length of stay: 3-4 weeks   Overall anticipated outcome: Supervision  Depending on your progress and recovery, your program may change. Your Inpatient Rehabilitation Care Coordinator will coordinate services and will keep you informed of any changes. Your Inpatient Rehabilitation Care Coordinator's name and contact numbers are listed  below.  The following services may also be recommended but are not provided by the Inpatient Rehabilitation Center:   Driving Evaluations  Home Health Rehabiltiation Services  Outpatient Rehabilitation Services  Vocational Rehabilitation   Arrangements will be made to provide these services after discharge if needed.  Arrangements include referral to agencies that provide these services.  Your insurance has been verified to be:  Uninsured  Your primary  doctor is:  No PCP  Pertinent information will be shared with your doctor and your insurance company.  Inpatient Rehabilitation Care Coordinator:  Susie Cassette [phone] or ([phone]  Information discussed with and copy given to patient by: Gretchen Short, 09/14/2020, 3:52 PM

## 2020-09-14 NOTE — Progress Notes (Signed)
Physical Therapy Session Note  Patient Details  Name: Allen [name] MRN: [id number] Date of Birth: [birth year]  Today's Date: 09/14/2020 PT Individual Time: 0235-0348 PT Individual Time Calculation (min): 73 min   Short Term Goals: Week 1:  PT Short Term Goal 1 (Week 1): Patient will perform basic transfers with CGA consistently. PT Short Term Goal 2 (Week 1): Patient will ambulate >150 ft with min A using LRAD. PT Short Term Goal 3 (Week 1): Patient will participate in one standardized balance test.  Skilled Therapeutic Interventions/Progress Updates:     Patient in secured enclosure bed, asking PT to open the bed so he could go to the bathroom, upon PT arrival. Patient alert and agreeable to PT session. Patient denied pain during session. Patient reported feeling "strange" today, upon further questioning patient described "intense sadness." Educated patient on emotional lability and general TBI deficits/symptoms and coping strategies during session.   Patient was very externally distracted by his phone throughout session. Would comply with requests to place his phone in his pocket, but refused to leave the phone in his room. He would pull out his phone within 2-3 min of putting it in his pocket. Noted poor awareness with use of his phone, pulling up alternative functions to what he was looking for several times and having difficulty recalling correct motor commands, swiping versus scrolling based on what the application required.   He would not participate in standing or ambulation activities long due to poor frustration tolerance with balance and gait deficits. Requesting or actively sitting or using the w/c instead. He perseverated on leaving to go to the store throughout session and required significant time to return to the bed, due to focus on getting in the w/c and getting out of the hospital to go to the store. Able to redirect after sitting in a low stim environment >10 min and patient  reporting increased fatigue.   Therapeutic Activity: Bed Mobility: Patient performed supine to/from sit independently.  Transfers: Patient performed stand pivot x2 with CGA-close supervision and sit to/from stand x2 with min A. Provided verbal cues for management of impulsivity. He performed an ambulatory toilet transfer to/from the bathroom with min-mod A for balance due to R foot drag and over correction for minor LOB ant/post. Patient was continent of bladder while toileting. Performed peri-care and lower body clothing management independently with min A for balance to pull pants over hips in standing. Patient became distracted by his phone by toileting requiring significant time and redirection to attend to getting off the toilet to continue session.   Gait Training:  Patient ambulated 68 feet with R arm over therapist's shoulder for support with min progressing to mod A with fatigue. Ambulated with decreased R foot clearance, ataxic gait with variable foot placement, and forward trunk lean. Provided verbal cues for erect posture, visual scanning due to R eye visual impairment, and increased R foot clearance for safety.  Wheelchair Mobility:  Patient propelled wheelchair >100 feet and around the day room with CGA-close supervision for cues for impulsivity and visual scanning. Patient alternated between use of B upper extremities and B lower extremities. Educated on use of breaks for safety throughout session.   Neuromuscular Re-ed: Patient performed the following activities: Attempted Berg Balance Test: Performed stand pivot with supervision, sit to/from stand without upper extremity support with supervision, and standing x1 min with supervision before pulling out his phone and turning to sit in the w/c due to decreased attention to task. Patient declined  any further balance testing, stating "I don't like how it feels."  Patient performed drawing task seated at table, declined standing. He was  able to recreate a drawing from his phone with good accuracy, he then drew what he saw in each eye when asked about his visual deficits. Drew the boxes in front of him for his L eye and 3 black dots with squiggles around them (reported to be yellow) as when he sees in his R eye.   Patient tolerated >45 min out of the room in moderately busy environment during session.  Patient in secured enclosure bed with Telesitter in the room at end of session with breaks locked, and all needs within reach.    Therapy Documentation Precautions:  Precautions Precautions: Fall,Other (comment) Precaution Comments: impulsive Restrictions Weight Bearing Restrictions: No  Therapy/Group: Individual Therapy  Halima Fogal L Makailah Slavick PT, DPT  09/14/2020, 7:53 PM

## 2020-09-15 MED ORDER — MEGESTROL ACETATE 400 MG/10ML PO SUSP
400.0000 mg | Freq: Two times a day (BID) | ORAL | Status: DC
  Administered 2020-09-15 – 2020-09-19 (×9): 400 mg via ORAL
  Filled 2020-09-15 (×9): qty 10

## 2020-09-15 NOTE — Progress Notes (Deleted)
Speech Language Pathology Daily Session Note  Patient Details  Name: Allen [name] MRN: [id number] Date of Birth: [birth year]  Today's Date: 09/15/2020 SLP Individual Time: 1191-4782 SLP Individual Time Calculation (min): 30 min  Short Term Goals: Week 1: SLP Short Term Goal 1 (Week 1): Patient will sustain attention to functional tasks for 15 minutes with Mod A verbal cues for redirection. SLP Short Term Goal 2 (Week 1): Patient will deomstrate functional problem solving for basic and familiar tasks with Mod A verbal cues. SLP Short Term Goal 3 (Week 1): Patient will self-monitor and correct errors with Mod A verbal cues to self-monitor an correct errors during functional tasks. SLP Short Term Goal 4 (Week 1): Patient will recall new, daily information with Mod A verbal cues use of external memory aids.  Skilled Therapeutic Interventions: Skilled treatment session focused on cognitive goals. Upon arrival, patient was awake in bed and requested to use the bathroom. Once out of bed, patient declined using the bathroom and reported he needed to go to the store. Patient with frequent loss of balance requiring Max A to correct. Despite Max A multimodal cues, patient perseverative on going to the store with poor awareness and judgement as to why that is unable to happen. Patient also declined all trials of thin liquids despite max encouragement and continues to decline participation in repeat MBS at this time. With extra time, patient returned to enclosure bed. Continue with current plan of care.      Pain No/Denies Pain   Therapy/Group: Individual Therapy  Lillian Ballester 09/15/2020, 12:14 PM
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
## 2020-09-15 NOTE — Progress Notes (Signed)
Occupational Therapy Session Note  Patient Details  Name: Allen [name] MRN: [id number] Date of Birth: Oct 18, 1985  Today's Date: 09/15/2020 OT Individual Time: 0700-0757 OT Individual Time Calculation (min): 57 min    Short Term Goals: Week 1:  OT Short Term Goal 1 (Week 1): Pt will demo improved awareness by requiring only min cueing for impulsivity during toileting OT Short Term Goal 2 (Week 1): Pt will complete toileting tasks with CGA OT Short Term Goal 3 (Week 1): Pt will attend to grooming tasks with no more than min cueing  Skilled Therapeutic Interventions/Progress Updates:    1:1. Pt received in bed agreeable to OT. Pt easily aroused. Focus of session on initiation, sequencing, termination, BADL at shower level, safety awareness, and functional transfers. Overall pt requires MIN A for squat pivot transfers EOB<>standard chair/w/c<>TTB. Pt perseverative on going to store, buying a car, buying a plane, as well as how much money he has. Pt easily redirected. Of note pt with poor standing balance with LOB laterally to L when exiting shower with MAX A to recover. Pt was able to acknowledge this was unsafe and this was why he needed staff with him when he mobilized. Pt with music on the entire session which was not a distraction in the shower but big distraction when pt said he wanted to eat breakfast. Ultimately pt did not eat anything and required 15 min to have pt to get into enclosure bed. Exited session with pt seated in bed, exit alarm on and call light in reach   Therapy Documentation Precautions:  Precautions Precautions: Fall,Other (comment) Precaution Comments: impulsive Restrictions Weight Bearing Restrictions: No General:   Vital Signs: Therapy Vitals Temp: 98.2 F (36.8 C) Pulse Rate: 72 Resp: 20 BP: 120/82 Patient Position (if appropriate): Lying Oxygen Therapy SpO2: 100 % O2 Device: Room Air Pain:   ADL: ADL Eating: Not assessed Grooming: Not  assessed Upper Body Bathing: Not assessed Lower Body Bathing: Not assessed Upper Body Dressing: Contact guard Where Assessed-Upper Body Dressing: Edge of bed Lower Body Dressing: Moderate assistance Where Assessed-Lower Body Dressing: Edge of bed Toileting: Minimal assistance Where Assessed-Toileting: Teacher, adult education: Moderate assistance Toilet Transfer Method: Ambulating Tub/Shower Transfer: Not assessed Film/video editor: Not assessed Vision   Perception    Praxis   Exercises:   Other Treatments:     Therapy/Group: Individual Therapy  Shon Hale 09/15/2020, 6:47 AM

## 2020-09-15 NOTE — Progress Notes (Addendum)
Patient ID: Allen [name], male   DOB: July 15, 1985, 35 y.o.   MRN: [id number]  SW met with pt, pt mother [name], uncle [name], and pt wife [name] in length with regard to discharge plan. Wife is willing to take him home, however is very unsure based on past negative behaviors he has had at home. She is very concerned about their living situation, in which they are late on rent since he has not paid rent this past month. She is overwhelmed as she is also responsible for her mother who has dementia, their 60 yo. Son, and she also has recently been diagnosed with Bi-Polar Disorder. Mother lives in [place], and uncle is visiting from [place]. There is no family that lives in this area. SW informed will continue to provide updates, and assist when able. She does confirm that she completed Medicaid application for pt, and stated she applied for disability with Medicaid reviewer here [place]. Wife encouraged to take pt phone home since it was as distraction, and pt was trying to buy things online and answering a lot of spam calls. Wife aware, we are looking into alternate options for him to have contact with his son in the evening since was the purpose of him keeping the phone. Explained at length, pt will be given phone when pt is more cognitively appropriate.   SW emailed Kenda Revels about status of application and waiting on follow-up.  *Confirms she has received all she needs for pt Medicaid and disability application for [facility].   Loralee Pacas, MSW, [place] Office: [phone] Cell: [phone] Fax: [phone]

## 2020-09-15 NOTE — Progress Notes (Signed)
Speech Language Pathology TBI Note  Patient Details  Name: Allen [name] MRN: [id number] Date of Birth: 01/20/1986  Today's Date: 09/15/2020 SLP Individual Time: 6073-7106 SLP Individual Time Calculation (min): 30 min  Short Term Goals: Week 1: SLP Short Term Goal 1 (Week 1): Patient will sustain attention to functional tasks for 15 minutes with Mod A verbal cues for redirection. SLP Short Term Goal 2 (Week 1): Patient will deomstrate functional problem solving for basic and familiar tasks with Mod A verbal cues. SLP Short Term Goal 3 (Week 1): Patient will self-monitor and correct errors with Mod A verbal cues to self-monitor an correct errors during functional tasks. SLP Short Term Goal 4 (Week 1): Patient will recall new, daily information with Mod A verbal cues use of external memory aids.  Skilled Therapeutic Interventions: Skilled treatment session focused on cognitive goals. Upon arrival, patient was awake in bed and requested to use the bathroom. Once out of bed, patient declined using the bathroom and reported he needed to go to the store. Patient with frequent loss of balance requiring Max A to correct. Despite Max A multimodal cues, patient perseverative on going to the store with poor awareness and judgement as to why that is unable to happen. Patient also declined all trials of thin liquids despite max encouragement and continues to decline participation in repeat MBS at this time. With extra time, patient returned to enclosure bed. Continue with current plan of care.      Pain No/Denies Pain  Agitated Behavior Scale: TBI Observation Details Observation Environment: Patient's room Start of observation period - Date: 09/15/20 Start of observation period - Time: 0945 End of observation period - Date: 09/15/20 End of observation period - Time: 1015 Agitated Behavior Scale (DO NOT LEAVE BLANKS) Short attention span, easy distractibility, inability to concentrate: Present to an  extreme degree Impulsive, impatient, low tolerance for pain or frustration: Present to an extreme degree Uncooperative, resistant to care, demanding: Present to an extreme degree Violent and/or threatening violence toward people or property: Present to a slight degree Explosive and/or unpredictable anger: Absent Rocking, rubbing, moaning, or other self-stimulating behavior: Absent Pulling at tubes, restraints, etc.: Absent Wandering from treatment areas: Present to a moderate degree Restlessness, pacing, excessive movement: Present to a moderate degree Repetitive behaviors, motor, and/or verbal: Present to an extreme degree Rapid, loud, or excessive talking: Absent Sudden changes of mood: Absent Easily initiated or excessive crying and/or laughter: Absent Self-abusiveness, physical and/or verbal: Absent Agitated behavior scale total score: 31  Therapy/Group: Individual Therapy  Morissa Obeirne 09/15/2020, 12:23 PM

## 2020-09-15 NOTE — IPOC Note (Signed)
Overall Plan of Care [facility]) Patient Details Name: Allen [name] MRN: [id number] DOB: [birth year]  Admitting Diagnosis: TBI (traumatic brain injury) [facility])  Hospital Problems: Principal Problem:   TBI (traumatic brain injury) (HCC)     Functional Problem List: Nursing Behavior,Bladder,Bowel,Endurance,Medication Management,Nutrition,Pain,Safety,Skin Integrity  PT Balance,Pain,Behavior,Perception,Edema,Safety,Endurance,Sensory,Motor,Skin Integrity,Nutrition  OT Balance,Cognition,Safety,Endurance,Motor,Vision,Behavior,Perception  SLP Cognition  TR         Basic ADL's: OT Bathing,Dressing,Toileting     Advanced  ADL's: OT       Transfers: PT Bed Mobility,Bed to Chair,Car,Furniture,Floor  OT Toilet,Tub/Shower     Locomotion: PT Ambulation,Wheelchair Mobility,Stairs     Additional Impairments: OT None  SLP Swallowing,Social Cognition   Social Interaction,Problem Solving,Memory,Attention,Awareness  TR      Anticipated Outcomes Item Anticipated Outcome  Self Feeding no goal set  Swallowing  Mod I   Basic self-care  supervision  Toileting  supervision   Bathroom Transfers supervision  Bowel/Bladder  Supervision  Transfers  Supervision  Locomotion  Supervision >200 ft using LRAD  Communication     Cognition  Supervision  Pain  <4  Safety/Judgment  Supervision and no falls   Therapy Plan: PT Intensity: Minimum of 1-2 x/day ,45 to 90 minutes PT Frequency: 5 out of 7 days PT Duration Estimated Length of Stay: 3-4 weeks OT Intensity: Minimum of 1-2 x/day, 45 to 90 minutes OT Frequency: 5 out of 7 days OT Duration/Estimated Length of Stay: 3-4 weeks SLP Intensity: Minumum of 1-2 x/day, 30 to 90 minutes SLP Frequency: 3 to 5 out of 7 days SLP Duration/Estimated Length of Stay: 3-4 weeks   Due to the current state of emergency, patients may not be receiving their 3-hours of Medicare-mandated therapy.   Team Interventions: Nursing Interventions  Patient/Family Education,Bladder Management,Bowel Management,Pain Management,Medication Management,Skin Care/Wound Management,Cognitive Remediation/Compensation,Discharge Planning,Psychosocial Support  PT interventions Ambulation/gait training,Cognitive remediation/compensation,Discharge planning,Functional mobility training,DME/adaptive equipment instruction,Pain management,Psychosocial support,Splinting/orthotics,Therapeutic Activities,UE/LE Strength taining/ROM,Visual/perceptual remediation/compensation,Wheelchair propulsion/positioning,UE/LE Coordination activities,Therapeutic Exercise,Stair training,Skin care/wound management,Patient/family education,Neuromuscular re-education,Functional electrical stimulation,Balance/vestibular training,Community reintegration,Disease management/prevention  OT Interventions Balance/vestibular training,Discharge planning,Pain management,Self Care/advanced ADL retraining,Therapeutic Activities,UE/LE Coordination activities,Visual/perceptual remediation/compensation,Therapeutic Exercise,Patient/family education,Functional mobility training,Cognitive remediation/compensation,Community reintegration,Psychosocial support,UE/LE Strength taining/ROM,DME/adaptive equipment instruction  SLP Interventions Cognitive remediation/compensation,Dysphagia/aspiration precaution training,Internal/external aids,Cueing hierarchy,Environmental controls,Therapeutic Activities,Patient/family education,Functional tasks  TR Interventions    SW/CM Interventions Discharge Planning,Psychosocial Support,Patient/Family Education   Barriers to Discharge MD  Medical stability  Nursing Decreased caregiver support,Home environment access/layout,Incontinence,Wound Care,Lack of/limited family support,Medication compliance,Behavior,Nutrition means    PT Decreased caregiver support,Home environment access/layout,Lack of/limited family support,Behavior Unsure of d/c disposition  OT Decreased caregiver  support Wife expressed concerns for her/children safety  SLP      SW Decreased caregiver support,Lack of/limited family support     Team Discharge Planning: Destination: PT-Home ,OT- Home , SLP-Home Projected Follow-up: PT-Outpatient PT, OT-  Home health OT, SLP-24 hour supervision/assistance,Outpatient SLP,Home Health SLP Projected Equipment Needs: PT-To be determined, OT- To be determined, SLP-None recommended by SLP Equipment Details: PT- , OT-  Patient/family involved in discharge planning: PT- Patient unable/family or caregiver not available,  OT-Family member/caregiver, SLP-Patient,Family member/caregiver  MD ELOS: 16-22 days Medical Rehab Prognosis:  Good Assessment: The patient has been admitted for CIR therapies with the diagnosis of TBI. The team will be addressing functional mobility, strength, stamina, balance, safety, adaptive techniques and equipment, self-care, bowel and bladder mgt, patient and caregiver education, NMR, cognitive perceptual rx, behavior rx, community reentry, swallowing/communication. Goals have been set at supervision for mobility and self-care, supervision for cognition.   Due to the current state of emergency, patients may not  be receiving their 3 hours per day of Medicare-mandated therapy.    Meredith Staggers, MD, FAAPMR      See Team Conference Notes for weekly updates to the plan of care

## 2020-09-15 NOTE — Progress Notes (Signed)
Physical Therapy TBI Note  Patient Details  Name: Allen [name] MRN: [id number] Date of Birth: 04-05-1986  PT Individual Time: [phone] and 1115-1200 PT Individual Time Calculation (min): 40 min and 45 min  Short Term Goals: Week 1:  PT Short Term Goal 1 (Week 1): Patient will perform basic transfers with CGA consistently. PT Short Term Goal 2 (Week 1): Patient will ambulate >150 ft with min A using LRAD. PT Short Term Goal 3 (Week 1): Patient will participate in one standardized balance test.  Skilled Therapeutic Interventions/Progress Updates:     Session 1: Patient in enclosure bed with Dr. Riley Kill, MD in the room upon PT arrival. Patient alert and agreeable to PT session. Patient denied pain during session.  Discussed patient's emotional lability, perseveration, and attention deficits with MD at beginning of session.   Patient continues to perseverate on going to the store. When asked what for he replied with various types of alcoholic beverages. Educated on impact of alcohol consumption following TBI. Discussed reason for wanting alcohol and patient reports that it is his way of coping with stress. Discussed alternative coping strategies, however, the patient was unable to attend to the conversation and began standing before therapist could continue. Will readdress with patient at a more appropriate time in his recovery when he is able to attend and retain information.   Patient very distracted by his phone throughout session again today, continued to pull it out during every rest break and during toileting. Noted that he had multiple spam emails pulled up and reported that he was getting money from all of them. With further questioning he reported that he has been providing these websites with his personal information to "get the money" and answered a spam phone call during the session. Educated on risk with providing these agencies his information and instructed him to ignore all phone  calls from numbers he does not know, CSW made aware.   Therapeutic Activity: Bed Mobility: Patient performed supine to/from sit with independently.  Transfers: Patient performed sit to/from stand x3 with supervision-CGA. Provided verbal cues for safety awareness and asking for assist to perform transfers. He performed an ambulatory toilet transfer with min-mod A for balance due to R foot drag and decreased balance strategies with LOB. Patient was continent of bladder and performed peri-care and lower body clothing management independently. Patient donned shoes independently prior to leaving the room.  Gait Training:  Patient ambulated >200 feet and >100 feet x2 with min/mod A with R arm over therapist's shoulder with improved trunk control and R foot clearance with facilitation for L weight shift. Provided verbal cues for visual scanning and attention to gait due to external distraction. Patient self-initiated sitting x1, removing his arm from therapist's shoulder and walking with strong forward trunk lean toward a chair, required mod/max A to maintain balance and safely sit down.  Once returned to the room the patient refused to return to the enclosure bed for 15 min. Reports feeling bored and wanting to go to the store. PT set goals with the patient to call for assistance and demonstrate he will wait for staff before getting up on his own to be able to remove enclosure bed and sit OOB in the room. Also set a goal to demonstrate ability to follow safety rules on the unit and focus on improved balance and gait with therapeutic activities during sessions to be able to go outside during a PT session. Patient in agreement. Continued to refuse to return to  the bed until PT told him that he would have another PT session to get OOB at 11am.   Patient in secured enclosure bed at end of session with breaks locked, Telesitter in place, and all needs within reach.    Session 2: Patient in w/c with his mother,  uncle, and Jackie Plum, CSW in the room upon PT arrival. Per charge nurse report, patient's family opened the enclosure bed and transferred him to the w/c without staff present prior to session. Education was provided by nursing staff to patient's family and by CSW, PT reinforced education during session. Patient alert and agreeable to PT session. Patient denied pain during session.  Continued to be distracted by his phone. Educated the patient's mother on concerns about patient using the phone due to providing personal information to scam emails and answering scam phone calls. Patient difficult to redirect from his phone and perseverated on buying tennis shoes. Discussed removal of patient's phone with CSW at end of session. CSW to follow-up with patient's family.   Therapeutic Activity: Bed Mobility: Patient performed sit to supine independently. Transfers: Patient performed sit to/from stand with min A-CGA x3. Patient's mother with questions about patient's diet, PT stepped away from the patient sitting in the w/c with the breaks locked to show her the patient's diet restrictions listed on the wall from SLP. While PT stepped away, the patient unlocked his breaks and stood-up. PT was able to get to the patient and provide max A to prevent patient from falling. Educated patient and family on deficits in safety awareness and impulsivity and need for enclosure bed for the patient's safety at this time.  Five times Sit to Stand Test (FTSS) Method: Use a straight back chair with a solid seat that is 16-18" high. Ask participant to sit on the chair with arms folded across their chest.   Instructions: "Stand up and sit down as quickly as possible 5 times, keeping your arms folded across your chest."   Measurement: Stop timing when the participant stands the 5th time.  TIME: __15.3____ (in seconds)  Times > 13.6 seconds is associated with increased disability and morbidity (Guralnik, 2000) Times > 15 seconds  is predictive of recurrent falls in healthy individuals aged 44 and older (Buatois, et al., 2008) Normal performance values in community dwelling individuals aged 79 and older (Bohannon, 2006): o 60-69 years: 11.4 seconds o 70-79 years: 12.6 seconds o 80-89 years: 14.8 seconds  MCID: ? 2.3 seconds for Vestibular Disorders (Meretta, 2006)  Attempted to initiate Berg Balance Test again today, patient externally distracted and began walking to the windows in the day room then preceded to continue walking and PT was unable to redirect him back to the balance test.  Gait Training:  Patient ambulated >350 feet and >500 feet as above for improved balance with gait, cardiovascular endurance, and reduced restlessness. Remained on locked unit, due to patient perseverating on getting on the elevators to go to the store. Patient able to locate his room at the end of the second ambulation  Patient in secured enclosure bed at end of session, after increased time reviewing patient's goals discussed in previous session, with breaks locked, Telesitter in place, and all needs within reach.   Therapy Documentation Precautions:  Precautions Precautions: Fall,Other (comment) Precaution Comments: impulsive Restrictions Weight Bearing Restrictions: No  Agitated Behavior Scale: TBI Session 1: Observation Details Observation Environment: [address] Start of observation period - Date: 09/15/20 Start of observation period - Time: 0830 End of observation  period - Date: 09/15/20 End of observation period - Time: 0915 Agitated Behavior Scale (DO NOT LEAVE BLANKS) Short attention span, easy distractibility, inability to concentrate: Present to an extreme degree Impulsive, impatient, low tolerance for pain or frustration: Present to an extreme degree Uncooperative, resistant to care, demanding: Present to a moderate degree Violent and/or threatening violence toward people or property: Absent Explosive and/or  unpredictable anger: Absent Rocking, rubbing, moaning, or other self-stimulating behavior: Absent Pulling at tubes, restraints, etc.: Absent Wandering from treatment areas: Absent Restlessness, pacing, excessive movement: Present to a slight degree Repetitive behaviors, motor, and/or verbal: Present to an extreme degree Rapid, loud, or excessive talking: Absent Sudden changes of mood: Absent Easily initiated or excessive crying and/or laughter: Absent Self-abusiveness, physical and/or verbal: Absent Agitated behavior scale total score: 26  Session 2: Observation Details Observation Environment: Patient's room Start of observation period - Date: 09/15/20 Start of observation period - Time: 1115 End of observation period - Date: 09/15/20 End of observation period - Time: 1200 Agitated Behavior Scale (DO NOT LEAVE BLANKS) Short attention span, easy distractibility, inability to concentrate: Present to an extreme degree Impulsive, impatient, low tolerance for pain or frustration: Present to an extreme degree Uncooperative, resistant to care, demanding: Present to a moderate degree Violent and/or threatening violence toward people or property: Absent Explosive and/or unpredictable anger: Present to a slight degree Rocking, rubbing, moaning, or other self-stimulating behavior: Absent Pulling at tubes, restraints, etc.: Absent Wandering from treatment areas: Present to a slight degree Restlessness, pacing, excessive movement: Present to a moderate degree Repetitive behaviors, motor, and/or verbal: Present to an extreme degree Rapid, loud, or excessive talking: Absent Sudden changes of mood: Absent Easily initiated or excessive crying and/or laughter: Absent Self-abusiveness, physical and/or verbal: Absent Agitated behavior scale total score: 29    Therapy/Group: Individual Therapy  Dario Yono L Maiko Salais PT, DPT  09/15/2020, 5:45 PM

## 2020-09-16 MED ORDER — QUETIAPINE FUMARATE 50 MG PO TABS
100.0000 mg | ORAL_TABLET | Freq: Every day | ORAL | Status: DC
  Administered 2020-09-16 – 2020-09-18 (×3): 100 mg via ORAL
  Filled 2020-09-16 (×3): qty 2

## 2020-09-16 MED ORDER — QUETIAPINE FUMARATE 50 MG PO TABS
50.0000 mg | ORAL_TABLET | Freq: Two times a day (BID) | ORAL | Status: DC
  Administered 2020-09-17 – 2020-09-19 (×5): 50 mg via ORAL
  Filled 2020-09-16 (×5): qty 1

## 2020-09-16 MED ORDER — DIVALPROEX SODIUM 500 MG PO DR TAB
500.0000 mg | DELAYED_RELEASE_TABLET | Freq: Three times a day (TID) | ORAL | Status: DC
  Administered 2020-09-16 – 2020-09-20 (×12): 500 mg via ORAL
  Filled 2020-09-16 (×13): qty 1

## 2020-09-16 MED ORDER — SENNOSIDES-DOCUSATE SODIUM 8.6-50 MG PO TABS
2.0000 | ORAL_TABLET | Freq: Two times a day (BID) | ORAL | Status: DC
  Administered 2020-09-16 – 2020-09-19 (×7): 2 via ORAL
  Filled 2020-09-16 (×8): qty 2

## 2020-09-16 NOTE — Progress Notes (Signed)
Occupational Therapy Session Note  Patient Details  Name: [name] MRN: [id number] Date of Birth: 1985/12/26  Today's Date: 09/16/2020 OT Individual Time: 1000-1045 OT Individual Time Calculation (min): 45 min    Short Term Goals: Week 1:  OT Short Term Goal 1 (Week 1): Pt will demo improved awareness by requiring only min cueing for impulsivity during toileting OT Short Term Goal 2 (Week 1): Pt will complete toileting tasks with CGA OT Short Term Goal 3 (Week 1): Pt will attend to grooming tasks with no more than min cueing  Skilled Therapeutic Interventions/Progress Updates:    1:1. Pt received in bed agreeable to getting out of bed but still with significant perseveration on going to the store. Pt completes functional mobility in room and hallway to assist with building awareness to balance deficits, endurance deficits, and strength deficits. Pt able ot admit it is not safe to walk by himself at the store. Pt begins wheeling forward ad backward around unit looking for doors but redirectable at emergency exits. 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 Pt getting sleepy by end of session and agreeabl eto get back in bed, " so you dont fall out of the chair asleep." Exited session with pt seated in bed enclure secured, exit alarm on and call light in reach   Therapy Documentation Precautions:  Precautions Precautions: Fall,Other (comment) Precaution Comments: impulsive Restrictions Weight Bearing Restrictions: No General:   Vital Signs: Therapy Vitals Temp: 99.5 F (37.5 C) Temp Source: Oral Pulse Rate: 80 Resp: 20 BP: 112/72 Patient Position (if appropriate): Lying Oxygen Therapy SpO2: 100 % O2 Device: Room Air Pain:   ADL: ADL Eating: Not assessed Grooming: Not assessed Upper Body  Bathing: Not assessed Lower Body Bathing: Not assessed Upper Body Dressing: Contact guard Where Assessed-Upper Body Dressing: Edge of bed Lower Body Dressing: Moderate assistance Where Assessed-Lower Body Dressing: Edge of bed Toileting: Minimal assistance Where Assessed-Toileting: Teacher, adult education: Moderate assistance Toilet Transfer Method: Ambulating Tub/Shower Transfer: Not assessed Film/video editor: Not assessed Vision   Perception    Praxis   Exercises:   Other Treatments:     Therapy/Group: Individual Therapy  Shon Hale 09/16/2020, 6:51 AM
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
## 2020-09-16 NOTE — Plan of Care (Signed)
Behavioral Plan   Rancho Level: Rancho 4  Behavior to decrease/ eliminate: difficulty getting back in bed; perseveration on topics, impulsivity   Changes to environment:   Blinds open/lights on during day Sleep/wake chart TV off No cell phone  Interventions:  - keep therapy contained to 4W behind egress doors - in therapy allow pt to pace in w/c as needed to move - enclosure bed   Recommendations for interactions with patient: - reorient as needed - try reorienting to safety/balance as reason for enclosure bed - intermittently better response to male staff  Attendees:  Toni Amend P. SLP Zannie Kehr OT

## 2020-09-16 NOTE — Progress Notes (Signed)
Physical Therapy Session Note  Patient Details  Name: Allen [name] MRN: [id number] Date of Birth: Feb 09, 1986  Today's Date: 09/16/2020 PT Individual Time: 0805-0820 AND [id number] PT Individual Time Calculation (min): 15 min and 45 min   Short Term Goals: Week 1:  PT Short Term Goal 1 (Week 1): Patient will perform basic transfers with CGA consistently. PT Short Term Goal 2 (Week 1): Patient will ambulate >150 ft with min A using LRAD. PT Short Term Goal 3 (Week 1): Patient will participate in one standardized balance test. Week 2:     Skilled Therapeutic Interventions/Progress Updates:   session 1 Pt received supine in bed. Pt greeted PT stating that he needed to go to store. PT redirected pt to situation and location. Pt did no open eyes. Pt encouraged pt to perform out of bed mobility to have breakfast or improve balance and strength, but pt did no initiate transfer from supine position. PT attempted to remove blanket, and pt pulled back over body. Pt unwilling to perform any OOB activity at this time. Will re-attempt at later time/date.  Session 2.  Pt received supine in bed and agreeable to PT. Supine>sit transfer without assist. Min assist transfer to [facility]. Pt performed WC mobility through hall. Pt joined Ue drumming group and participated in 30 minutes of guided rhythmic UE exercises. Pt able to follow instruction ~75% of drumming activity. Upon completion, pt became perceverative on going to store. Pt refused to return to room unless he went to the store first. [facility] propulsion through unit with supervision assist with cues for safety, doorway management. Pt then left with additional PT for treatment.       Therapy Documentation Precautions:  Precautions Precautions: Fall,Other (comment) Precaution Comments: impulsive Restrictions Weight Bearing Restrictions: No General: PT Amount of Missed Time (min): 45 Minutes Vital Signs: Therapy Vitals Temp: 99.2 F (37.3 C) Temp Source:  Oral Pulse Rate: 88 Resp: 17 BP: 106/62 Patient Position (if appropriate): Lying Oxygen Therapy SpO2: 100 % O2 Device: Room Air Pain: denies   Therapy/Group: Individual Therapy  Golden Pop 09/16/2020, 5:58 PM

## 2020-09-16 NOTE — Progress Notes (Signed)
Physical Therapy TBI Note  Patient Details  Name: Allen [name] MRN: [id number] Date of Birth: September 10, 1985  Today's Date: 09/16/2020 PT Individual Time: 1445-1530 PT Individual Time Calculation (min): 45 min   Short Term Goals: Week 1:  PT Short Term Goal 1 (Week 1): Patient will perform basic transfers with CGA consistently. PT Short Term Goal 2 (Week 1): Patient will ambulate >150 ft with min A using LRAD. PT Short Term Goal 3 (Week 1): Patient will participate in one standardized balance test.  Skilled Therapeutic Interventions/Progress Updates:     Pt received as handoff from other PT as pt had not been willing to go back to bed following prior session. Reports pain in throat. Number not provided. No intervention needed. Pt self propels WC >1000' throughout session, requiring occasional redirection as pt attempts to push WC into restricted areas of hospital. Pt perseverating on "going to store" throughout session. PT reorients pt to situation and explains why going to store is not a safe option, but pt seems to quickly forget and continues to ask about going to store. Pt performs Wii bowling game for NMR with cognitive overlay and attention to task. Pt requires occasional cues on maintaining attention but otherwise performs activity consistently attempting to wander from treatment area. Following, pt propels WC to gym and performs sit to stand with minA, ambulating x25' with minA. Pt tasked with performing BITs memory activity but pt is uninterested and propels WC out of room to "go to the store". PT able to redirect pt back to room. Pt attempts to get out of WC independently and "trips" over foot rests, requiring minA for safety. PT educates on allowing staff to help with mobility. Pt transfers back to bed. Left supine in posey bed with call bell in hand.  Therapy Documentation Precautions:  Precautions Precautions: Fall,Other (comment) Precaution Comments: impulsive Restrictions Weight  Bearing Restrictions: No    Therapy/Group: Individual Therapy  Beau Fanny, PT, DPT 09/16/2020, 3:55 PM

## 2020-09-16 NOTE — Progress Notes (Signed)
SLP Cancellation Note  Patient Details Name: Allen [name] MRN: [id number] DOB: 1986-02-26   Cancelled treatment:       Patient missed 45 minutes of session due to fatigue and lethargy. Will re-attempt as able.                                                                                                 [name] 09/16/2020, 11:05 AM

## 2020-09-17 MED ORDER — MAGNESIUM CITRATE PO SOLN
1.0000 | Freq: Once | ORAL | Status: AC
  Administered 2020-09-17: 1 via ORAL
  Filled 2020-09-17: qty 296

## 2020-09-17 MED ORDER — PROPRANOLOL HCL 10 MG PO TABS
10.0000 mg | ORAL_TABLET | Freq: Every day | ORAL | Status: DC
  Administered 2020-09-17: 10 mg via ORAL
  Filled 2020-09-17: qty 1

## 2020-09-17 MED ORDER — PROPRANOLOL HCL 10 MG PO TABS
10.0000 mg | ORAL_TABLET | Freq: Two times a day (BID) | ORAL | Status: DC
  Administered 2020-09-18 – 2020-09-19 (×3): 10 mg via ORAL
  Filled 2020-09-17 (×3): qty 1

## 2020-09-17 NOTE — Progress Notes (Signed)
Physical Therapy Session Note  Patient Details  Name: Allen [name] MRN: [id number] Date of Birth: Jul 01, 1985  Today's Date: 09/17/2020 PT Individual Time: 1500-1615 PT Individual Time Calculation (min): 75 min   Short Term Goals: Week 1:  PT Short Term Goal 1 (Week 1): Patient will perform basic transfers with CGA consistently. PT Short Term Goal 2 (Week 1): Patient will ambulate >150 ft with min A using LRAD. PT Short Term Goal 3 (Week 1): Patient will participate in one standardized balance test.  Skilled Therapeutic Interventions/Progress Updates:   Pt received sitting in WC and agreeable to PT. PT instructed pt in functional tasks of IADL of applying lotion for dry and cracked skin on BLE for feet, ankle, calves. Gait training through hall with intermittent UE support on PT shoulder and without UE support. Min-mod assist when pt supported with arm on PT and mod-max assist intermittently to prevent falls. Pt reported that feet are sore and feeling tired. PT attempted to redirect pt to bed throughout session, but pt refused stating that he hates the bed, because "every time I get in, the staff locks me in there." pt performed sustained attention task for wii resort basketball, but unable to sustain interest for bowling of sword play. Pt left with OT at end of treatment session for additional skilled therapy. Pt noted to be distracted by phone intermittently throughout session. Of note, safety plan prohibits pt from having phone, but pt states that Wife brought it to him this morning. Pt also continues to perseverate on gettingout out of hospital to go to store, and only able to be redirection occasionally with functional tasks.       Therapy Documentation Precautions:  Precautions Precautions: Fall,Other (comment) Precaution Comments: impulsive Restrictions Weight Bearing Restrictions: No  Vital Signs: Therapy Vitals Temp: 98 F (36.7 C) Pulse Rate: (!) 102 Resp: 19 BP:  110/68 Patient Position (if appropriate): Sitting Oxygen Therapy SpO2: 100 % O2 Device: Room Air Pain: Denies at rest  Therapy/Group: Individual Therapy  Golden Pop 09/17/2020, 5:31 PM
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
## 2020-09-17 NOTE — Progress Notes (Signed)
Speech Language Pathology TBI Note  Patient Details  Name: Allen [name] MRN: [id number] Date of Birth: 1985/07/09  Today's Date: 09/17/2020 SLP Individual Time: 0800-0900 SLP Individual Time Calculation (min): 60 min  Short Term Goals: Week 1: SLP Short Term Goal 1 (Week 1): Patient will sustain attention to functional tasks for 15 minutes with Mod A verbal cues for redirection. SLP Short Term Goal 2 (Week 1): Patient will deomstrate functional problem solving for basic and familiar tasks with Mod A verbal cues. SLP Short Term Goal 3 (Week 1): Patient will self-monitor and correct errors with Mod A verbal cues to self-monitor an correct errors during functional tasks. SLP Short Term Goal 4 (Week 1): Patient will recall new, daily information with Mod A verbal cues use of external memory aids.  Skilled Therapeutic Interventions:Skilled ST services focused on swallow and cognitive skills. Pt was agreeable services and pleasant. Pt initially sat on side of bed but then requested to sit in [facility], pt was impulsive and required max A verbal cues to complete safe transfer. Pt continuously requested changes in environments every 30 seconds to 2 minutes, taking on/off pants/sweat shirt, blankets and requesting air adjustments. Pt only consumed nectar thick liquids via straw, while impulsive no overt s/s aspiration noted, Pt refused solid textures on breakfast tray and items present from nurse's station by SLP. Pt required max verbal encouragement to consume medication due to internal distraction on environment changes and preservating on going to the "store." Pt was able to redirect attention x3 in card sorting task with ability to sort up to 5 colors with mod I problem solving skills and ability to reduce impulsivity (throwing cards) with mod A verbal cues. Pt refused to return to bed at the end of the ST session, NT attempted to assist but pt continued to perseverate on need to go to the "store." SLP handed  off pt in [facility] to entering OT. SLP recommends to continue skilled services.     Pain Pain Assessment Pain Score: 0-No pain  Agitated Behavior Scale: TBI Observation Details Observation Environment: CIR Start of observation period - Date: 09/17/20 Start of observation period - Time: 0800 End of observation period - Date: 09/17/20 End of observation period - Time: 0900 Agitated Behavior Scale (DO NOT LEAVE BLANKS) Short attention span, easy distractibility, inability to concentrate: Present to an extreme degree Impulsive, impatient, low tolerance for pain or frustration: Present to a moderate degree Uncooperative, resistant to care, demanding: Present to a moderate degree Violent and/or threatening violence toward people or property: Present to a slight degree Explosive and/or unpredictable anger: Present to a slight degree Rocking, rubbing, moaning, or other self-stimulating behavior: Absent Pulling at tubes, restraints, etc.: Absent Wandering from treatment areas: Absent Restlessness, pacing, excessive movement: Present to a slight degree Repetitive behaviors, motor, and/or verbal: Present to a slight degree Rapid, loud, or excessive talking: Absent Sudden changes of mood: Present to a slight degree Easily initiated or excessive crying and/or laughter: Absent Self-abusiveness, physical and/or verbal: Absent Agitated behavior scale total score: 26  Therapy/Group: Individual Therapy  Allen [name]  [facility] 09/17/2020, 12:52 PM

## 2020-09-17 NOTE — Plan of Care (Signed)
  Problem: RH SAFETY Goal: RH STG ADHERE TO SAFETY PRECAUTIONS W/ASSISTANCE/DEVICE Description: STG Adhere to Safety Precautions With Supervision Assistance/Device. Outcome: Not Progressing; telesitter

## 2020-09-17 NOTE — Progress Notes (Signed)
Occupational Therapy TBI Note  Patient Details  Name: Allen [name] MRN: [id number] Date of Birth: [birth year]  58 min  [id number]  Short Term Goals: Week 1:  OT Short Term Goal 1 (Week 1): Pt will demo improved awareness by requiring only min cueing for impulsivity during toileting OT Short Term Goal 2 (Week 1): Pt will complete toileting tasks with CGA OT Short Term Goal 3 (Week 1): Pt will attend to grooming tasks with no more than min cueing  Skilled Therapeutic Interventions/Progress Updates:    Pt received in w/c with SLP not wanting to get into bed. Pt initially agreeable to walking in hallway with pt arm around OT shoulders with MIN A Overall. Pt directed to Nustep but pt does not pedal despite cuing. Pt with intermittent listening to OT cues for safety with w/c brakes. Pt very restless throughotu session but also complaining of being sleepy. Pt redirected to couch in dayroom where pt able to sit and attempted to get comfortable to take a nap. Engaged in conversation about gaming and pt requesting to play the Wii-100 pin bowling "bigger is better." Pt continues to confabulate about making 50k in the hospital, and perseverate on "going to the store." Pt unable to name what store he wants to go to. When wii set up, pt reporting wanting to go back to bed. Pt requesting ginger ale and thickened drink provided with max A to manage sip size. Pt returned to supine and upset OT closed bed but didn't get agitated. OT explained there "were many instances that you were up on your feet and almost fell. I dont trust you to not get up." pt states, "facts." Exited session with pt seated in enclosure bed, exit alarm on and call light in reach   Therapy Documentation Precautions:  Precautions Precautions: Fall,Other (comment) Precaution Comments: impulsive Restrictions Weight Bearing Restrictions: No General:   Vital Signs: Therapy Vitals Temp: 98.4 F (36.9 C) Temp Source: Oral Pulse Rate:  89 Resp: 18 BP: 117/75 Patient Position (if appropriate): Sitting Oxygen Therapy SpO2: 100 % O2 Device: Room Air Pain:   Agitated Behavior Scale: TBI  Observation Details Observation Environment: 4W Start of observation period - Date: 09/17/20 Start of observation period - Time: 0900 End of observation period - Date: 09/17/20 End of observation period - Time: 1000 Agitated Behavior Scale (DO NOT LEAVE BLANKS) Short attention span, easy distractibility, inability to concentrate: Present to a moderate degree Impulsive, impatient, low tolerance for pain or frustration: Absent Uncooperative, resistant to care, demanding: Present to a moderate degree Violent and/or threatening violence toward people or property: Absent Explosive and/or unpredictable anger: Absent Rocking, rubbing, moaning, or other self-stimulating behavior: Absent Pulling at tubes, restraints, etc.: Absent Wandering from treatment areas: Present to a slight degree Restlessness, pacing, excessive movement: Present to a slight degree Repetitive behaviors, motor, and/or verbal: Present to a slight degree Rapid, loud, or excessive talking: Absent Sudden changes of mood: Absent Easily initiated or excessive crying and/or laughter: Absent Self-abusiveness, physical and/or verbal: Absent Agitated behavior scale total score: 21  ADL: ADL Eating: Not assessed Grooming: Not assessed Upper Body Bathing: Not assessed Lower Body Bathing: Not assessed Upper Body Dressing: Contact guard Where Assessed-Upper Body Dressing: Edge of bed Lower Body Dressing: Moderate assistance Where Assessed-Lower Body Dressing: Edge of bed Toileting: Minimal assistance Where Assessed-Toileting: Teacher, adult education: Moderate assistance Toilet Transfer Method: Ambulating Tub/Shower Transfer: Not assessed Film/video editor: Not assessed Vision   Perception    Praxis  Exercises:   Other Treatments:     Therapy/Group:  Individual Therapy  Shon Hale 09/17/2020, 6:49 AM

## 2020-09-17 NOTE — Progress Notes (Signed)
Patient did not want to go back to bed ; NT has to stay with patient 1:! Lunch time and after therapy sessions and  for dinner and still does not want to go to bed after; wife in room and left but another visitor came and when the visitor was getting ready to leave. Patient has collected all his belongings and was going with the visitor. Patient was agitated and RN has to call security for help. Security helped patient back in bed.

## 2020-09-18 MED ORDER — QUETIAPINE FUMARATE 25 MG PO TABS: 25.0000 mg | ORAL_TABLET | Freq: Two times a day (BID) | ORAL | Status: DC | PRN

## 2020-09-18 NOTE — Progress Notes (Addendum)
Patient had mag citrate for bowel movement last night per report no bm since med given; patient requested to get up to eat for breakfast  but will not eat his food ; he keeps on saying he needs to get out to buy shoes, clothes and was asking for his celfone. Patient took his meds but refused meg ace and laxatives. Patient refused to get back in bed. It took 4 people to get patient back in bed. Leanne NT help a lot! MD made aware of patient's behavior.

## 2020-09-18 NOTE — Plan of Care (Signed)
  Problem: RH BOWEL ELIMINATION Goal: RH STG MANAGE BOWEL WITH ASSISTANCE Description: STG Manage Bowel with Supervision Assistance. Outcome: Not Progressing; LBM 4/26; patient has a poor appetite; mg citrate given last night no bm; refused senokot this morning   Problem: RH SAFETY Goal: RH STG ADHERE TO SAFETY PRECAUTIONS W/ASSISTANCE/DEVICE Description: STG Adhere to Safety Precautions With Supervision Assistance/Device. Outcome: Not Progressing; enclosure bed

## 2020-09-18 NOTE — Progress Notes (Signed)
Physical Therapy TBI Note  Patient Details  Name: Allen [name] MRN: [id number] Date of Birth: [birth year]  Today's Date: 09/18/2020 PT Individual Time: 1430-1500 PT Individual Time Calculation (min): 30 min   Short Term Goals: Week 1:  PT Short Term Goal 1 (Week 1): Patient will perform basic transfers with CGA consistently. PT Short Term Goal 2 (Week 1): Patient will ambulate >150 ft with min A using LRAD. PT Short Term Goal 3 (Week 1): Patient will participate in one standardized balance test.  Skilled Therapeutic Interventions/Progress Updates:     Patient in secured enclosure bed with his wife in the room reporting that she brought food from home for him to eat upon PT arrival. Patient alert and agreeable to PT session. Patient denied pain during session.  Patient sat EOB to eat lunch with supervision for safety. Patient did not participate in feeding himself. His wife provided total A for feeding with patent eating only 3-4 bites. Patient asking for "regular sprite" indicated unthickened sprite rather than thickened cup provided by RN. Educated on dysphasia, diet restrictions, and aspiration pneumonia.   Patient remained sitting throughout session. Appeared drowsy with poor participation in eating and discussion of patient's deficits, POC, PT goals, behavior plan, and safety concerns with patient's wife. Patient interjecting that he wanted to leave with his wife, he wants to go to the store, and that he will hurt staff if they "lay hands" on him to put him back in the bed. Educated on behavior management and discussed goals for action plan this week to improve restlessness, agitation, patient compliance, time OOB, and reduce the use of medications as the medical team sees appropriate.   Patient's wife asked several questions about patient's progress and activities in therapy. Stating, "I could take him home and do those things with him," and "he is the the bed way too much." Educated on  patient's attention deficits and inability to attend to tasks outside of automatic functional mobility at this tme, along with education above, will need further reinforcement. Plan for family meeting this week, will discussed scheduling with CSW.  Patient in secure enclosure bed with his wife in the room at end of session with breaks locked, and all needs within reach.   Following session, educated patient's wife on reasons for phone restrictions to reduce unintentional distribution of patient's personal information to scam companies via email or phone based on signs of this found during PT session last week. Patient's wife would prefer to leave the phone with the patient without a charger, advised against this at this time and discussed with nursing to provide patient with assistance with room phone to call his family when requested. Will continue to discuss concerns with the team this week.    Therapy Documentation Precautions:  Precautions Precautions: Fall,Other (comment) Precaution Comments: impulsive Restrictions Weight Bearing Restrictions: No Agitated Behavior Scale: TBI Observation Details Observation Environment: Patient's room Start of observation period - Date: 09/18/20 Start of observation period - Time: 1430 End of observation period - Date: 09/18/20 End of observation period - Time: 1500 Agitated Behavior Scale (DO NOT LEAVE BLANKS) Short attention span, easy distractibility, inability to concentrate: Present to a slight degree Impulsive, impatient, low tolerance for pain or frustration: Present to a slight degree Uncooperative, resistant to care, demanding: Absent Violent and/or threatening violence toward people or property: Present to a slight degree Explosive and/or unpredictable anger: Absent Rocking, rubbing, moaning, or other self-stimulating behavior: Absent Pulling at tubes, restraints, etc.: Absent Wandering from  treatment areas: Absent Restlessness, pacing,  excessive movement: Absent Repetitive behaviors, motor, and/or verbal: Present to a moderate degree Rapid, loud, or excessive talking: Absent Sudden changes of mood: Absent Easily initiated or excessive crying and/or laughter: Absent Self-abusiveness, physical and/or verbal: Absent Agitated behavior scale total score: 19   Therapy/Group: Individual Therapy  Delaney Schnick L Kiely Cousar PT, DPT  09/18/2020, 5:38 PM
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
## 2020-09-18 NOTE — Progress Notes (Signed)
Patient was assisted to use the bathroom and will not go back to bed. Pt. Was very agitated and complained of going to the store. Staff has to stay in the room 1;1 to provide safety. Pt. Was still insisting going out to the store. Security was notified by RN and assisted pt. To bed.

## 2020-09-19 LAB — COMPREHENSIVE METABOLIC PANEL
ALT: 66 U/L — ABNORMAL HIGH (ref 0–44)
AST: 26 U/L (ref 15–41)
Albumin: 3.5 g/dL (ref 3.5–5.0)
Alkaline Phosphatase: 94 U/L (ref 38–126)
Anion gap: 10 (ref 5–15)
BUN: 19 mg/dL (ref 6–20)
CO2: 29 mmol/L (ref 22–32)
Calcium: 9.8 mg/dL (ref 8.9–10.3)
Chloride: 99 mmol/L (ref 98–111)
Creatinine, Ser: 1.04 mg/dL (ref 0.61–1.24)
GFR, Estimated: >60 mL/min (ref >60)
Glucose, Bld: 90 mg/dL (ref 70–99)
Potassium: 4.1 mmol/L (ref 3.5–5.1)
Sodium: 138 mmol/L (ref 135–145)
Total Bilirubin: 0.4 mg/dL (ref 0.3–1.2)
Total Protein: 7.1 g/dL (ref 6.5–8.1)

## 2020-09-19 MED ORDER — METHOCARBAMOL 500 MG PO TABS
500.0000 mg | ORAL_TABLET | Freq: Three times a day (TID) | ORAL | Status: DC
  Administered 2020-09-19: 500 mg via ORAL
  Filled 2020-09-19 (×3): qty 1

## 2020-09-19 MED ORDER — SORBITOL 70 % SOLN
60.0000 mL | Status: AC
  Filled 2020-09-19: qty 60

## 2020-09-19 MED ORDER — QUETIAPINE FUMARATE 25 MG PO TABS
25.0000 mg | ORAL_TABLET | Freq: Two times a day (BID) | ORAL | Status: DC
  Administered 2020-09-19: 25 mg via ORAL
  Filled 2020-09-19: qty 1

## 2020-09-19 MED ORDER — QUETIAPINE FUMARATE 50 MG PO TABS
100.0000 mg | ORAL_TABLET | Freq: Every day | ORAL | Status: DC
  Administered 2020-09-19: 100 mg via ORAL
  Filled 2020-09-19: qty 2

## 2020-09-19 NOTE — Progress Notes (Signed)
Patient has not had a bowel movement since 4/26. Per MD orders patient was ordered a one time dose of sorbitol. Patient refused to take medication. Patient was educated on the importance of regular bowel movements and the benefits of taking laxative medication. Patient still refused medication.

## 2020-09-19 NOTE — Progress Notes (Signed)
Patient ID: Allen [name], male   DOB: 01-May-1986, 35 y.o.   MRN: [id number]  SW received phone call from pt wife who requested to meet with SW. SW went to room and she reported concerns with regard to staffing over the weekend, and is prepared to take her husband home. SW spoke with attending about pt concerns. He intends to speak with wife later on today. Wife is amenable to waiting to speak with the physician.   Cecile Sheerer, MSW, LCSWA Office: [phone] Cell: [phone] Fax: [phone]

## 2020-09-19 NOTE — Progress Notes (Signed)
SLP Cancellation Note  Patient Details Name: Allen [name] MRN: [id number] DOB: Dec 07, 1985   Cancelled treatment:       Patient missed 30 minutes of skilled SLP intervention due to fatigue. Upon arrival, patient was asleep in the enclosure bed and slow to arouse despite verbal and tactile cues. Patient had been up most of the morning in therapy or up in the recliner with his wife. The physician also attempted to engage patient with minimal success. Due to fatigue, patient missed session but did report he would be willing to participate in [facility] tomorrow. Will re-attempt as able.                                                                                                 Genoveva Singleton 09/19/2020, 2:06 PM
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
## 2020-09-19 NOTE — Progress Notes (Signed)
Occupational Therapy Session Note  Patient Details  Name: Allen [name] MRN: [id number] Date of Birth: 09-22-1985  Today's Date: 09/19/2020 OT Individual Time: 0350-0938 OT Individual Time Calculation (min): 15 min  and Today's Date: 09/19/2020 OT Missed Time: 30 Minutes Missed Time Reason: Patient unwilling/refused to participate without medical reason   Short Term Goals: Week 1:  OT Short Term Goal 1 (Week 1): Pt will demo improved awareness by requiring only min cueing for impulsivity during toileting OT Short Term Goal 2 (Week 1): Pt will complete toileting tasks with CGA OT Short Term Goal 3 (Week 1): Pt will attend to grooming tasks with no more than min cueing   Skilled Therapeutic Interventions/Progress Updates:    Pt greeted at time of session reclined on L side with phone in hand in enclosure bed, pleasant but easily distracted and perseverating on going to the store, visiting various states, leaving, etc. Pt frequently redirected and refused toileting (stating he just went), getting OOB/out of room, going for a walk, etc. Pt educated several time on importance of OOB activity and max encouragement, but continued to refuse. Pt in side lying with Posey bed in tact and call bell in reach. Missed 30 mins of OT.   Therapy Documentation Precautions:  Precautions Precautions: Fall,Other (comment) Precaution Comments: impulsive Restrictions Weight Bearing Restrictions: No     Therapy/Group: Individual Therapy  Erasmo Score 09/19/2020, 7:29 AM

## 2020-09-19 NOTE — Progress Notes (Signed)
Physical Therapy TBI Note  Patient Details  Name: Allen [name] MRN: [id number] Date of Birth: 09-Nov-1985  Today's Date: 09/19/2020 PT Individual Time: 1045-1130 PT Individual Time Calculation (min): 45 min   Short Term Goals: Week 1:  PT Short Term Goal 1 (Week 1): Patient will perform basic transfers with CGA consistently. PT Short Term Goal 2 (Week 1): Patient will ambulate >150 ft with min A using LRAD. PT Short Term Goal 3 (Week 1): Patient will participate in one standardized balance test.  Skilled Therapeutic Interventions/Progress Updates:     Patient in enclosure bed with his wife and CSW in the room upon PT arrival. Patient alert and agreeable to PT session. Patient reported having a headache during session, RN made aware. PT provided repositioning, rest breaks, and distraction as pain interventions throughout session.   Patient's wife expressed concerns about interventions to manage patient's agitation and non-compliance over the weekend and about limited opportunities for the patient to get OOB. Reports he is a very physically active person at baseline. His wife expressed that she felt she could provide a better environment for him to recover at home.   Focused session on hands-on family training and education on home safety, behavior management and communication strategies due to patient's present deficits in awareness, attention, poor frustration tolerance, safety awareness, orientation, perseveration, and reasoning skills.   Therapeutic Activity: Bed Mobility: Patient performed supine to sit with independently.  Transfers: Patient performed sit to/from stand and stand pivot transfer with close supervision-CGA provided by PT or his wife. Provided cues for management of impulsivity when standing and education his wife about need to be within-arms reach 24/7 to ensure safety due to patient's impulsivity and decreased safety awareness and balance at this time.  Gait Training:   Patient ambulated >150 feet x2 with min A with 1 arm over his wife's shoulder and PT SBA for safety. Ambulated with mild ataxic gait, forward trunk lean, decreased gait speed, and poor visual scanning and spatial  awareness. Provided verbal cues for increased step height, remaining close to the patient to safely support him due to balance deficits and impulsivity.  Patient's wife reported that the patient told her his vision in his R eye was improving last night, however, patient denies this at this time, stating, "I still can't see out of it." Educated on awareness and cognitive deficits due to TBI leading to inconsistent reporting of symptoms.   Patient ascended/descended 4 6" steps using B rails to simulate home entry with min A +2 due to impulsivity and poor safety awareness. Patient required significant time and encouragement to perform steps and initiated standing and stairs impulsively and quickly demonstrating poor frustration tolerance with being asked to perform the task.   Patient perseverated on going home throughout session. Stated, "I will sit here until I am going out the door," several times. Declined performing car transfer or any other functional mobility or interventions. His wife engaged in rationalizing with the patient about participating without success. Educated on current cognitive deficits and encourage use of redirection versus engaging the patient to reduce frustration and agitation and improve compliance with requests.   Also, educated on consequences of alcohol use following TBI, as patient disclosed use of alcohol as a coping mechanism in a past session, patient's wife confirms this to be accurate. Encouraged his wife to remove alcohol from the home, limit visitors following d/c to allow patient to focus on recovery and reduce stressors. Informed her and the patient that he  will not be safe to drive at d/c and that any keys should be placed where they are not accessible to  the patient. Patient demonstrated poor awareness by becoming upset and stating he would be driving once he returned home. Patient's wife expressed concern about this behavior.   Patient agreeable to return to the room, but refusing to return to the bed. Allowed patient to sit in the recliner with B legs elevated, patient assisted with placing chair alarm under him following explaination of purpose of the device. Cleared patient's wife to ambulate the patient in the room if necessary due to patient's impulsivity, but discouraged frequent ambulation for improved patient participation in therapy.   Ensured the patient and his wife that the team will be working toward improved time OOB and that she would be informed by CSW about updates tomorrow. Patient's wife appreciative of education and training during session.  Patient in the recliner in the room with his wife within-arms reach, and Telesitter in place at end of session with breaks locked, chair alarm set, and all needs within reach.  Discussed patient's wife's concerns and increasing patient's time OOB with RN, charge nurse, and nursing care coordinator.  MD also made aware following session.    Therapy Documentation Precautions:  Precautions Precautions: Fall,Other (comment) Precaution Comments: impulsive Restrictions Weight Bearing Restrictions: No General: PT Amount of Missed Time (min): 45 Minutes PT Missed Treatment Reason: Patient unwilling to participate Agitated Behavior Scale: TBI Observation Details Observation Environment: CIR Start of observation period - Date: 09/19/20 Start of observation period - Time: 1045 End of observation period - Date: 09/19/20 End of observation period - Time: 1130 Agitated Behavior Scale (DO NOT LEAVE BLANKS) Short attention span, easy distractibility, inability to concentrate: Present to a slight degree Impulsive, impatient, low tolerance for pain or frustration: Present to a moderate  degree Uncooperative, resistant to care, demanding: Present to a moderate degree Violent and/or threatening violence toward people or property: Present to a slight degree Explosive and/or unpredictable anger: Absent Rocking, rubbing, moaning, or other self-stimulating behavior: Absent Pulling at tubes, restraints, etc.: Absent Wandering from treatment areas: Absent Restlessness, pacing, excessive movement: Absent Repetitive behaviors, motor, and/or verbal: Present to a moderate degree Rapid, loud, or excessive talking: Present to a slight degree Sudden changes of mood: Absent Easily initiated or excessive crying and/or laughter: Absent Self-abusiveness, physical and/or verbal: Absent Agitated behavior scale total score: 23   Therapy/Group: Individual Therapy  Antavia Tandy L Leta Bucklin PT, DPT  09/19/2020, 4:26 PM

## 2020-09-19 NOTE — Progress Notes (Signed)
Spoke with Mrs. Geisler about her husband and the events over weekend. She had some concerns about the approaches taken by security and rehab team to address his agitation. She says that he always was active and never wanted to sit still in one place. A lot of his agitation has centered around going back into the enclosure bed after therapies, meals, etc. Also she feels that the PRN doses of seroquel used to calm him down, just make him sleepy and unable to tolerate therapy.   Plan: 1) discussing with nursing team re: capability of having a sitter at least during the day to allow him out of the enclosure bed 2) non-medicinal strategies for de-escalation whenever possible 3) dc sq heparin injections which have been an irritant to him 4) reduce robaxin to 500mg  q8---probably can change to prn tomorrow 5) reduce seroquel (dc prn doses and decrease day time doses to 25mg  bid)   Wife discussed his prior behaviors and concerns and that she felt he had some kind of untreated psychiatric condition. When I mentioned manic behavior, she seemed to agree with that. She understands that he probably needs medication to help control these behaviors now and in the longer term after discharge.    , MD, [facility] 09/19/2020

## 2020-09-19 NOTE — Progress Notes (Incomplete)
Physical Therapy Session Note  Patient Details  Name: FINLAY GODBEE MRN: [id number] Date of Birth: 02-10-1986  Today's Date: 09/19/2020 PT Individual Time:  -      Short Term Goals: Week 1:  PT Short Term Goal 1 (Week 1): Patient will perform basic transfers with CGA consistently. PT Short Term Goal 2 (Week 1): Patient will ambulate >150 ft with min A using LRAD. PT Short Term Goal 3 (Week 1): Patient will participate in one standardized balance test.  Skilled Therapeutic Interventions/Progress Updates:     Patient in *** upon PT arrival. Patient alert and agreeable to PT session. Patient denied pain during session.  Therapeutic Activity: Bed Mobility: Patient performed supine to/from sit with ***. Provided verbal cues for ***. Transfers: Patient performed sit to/from stand x*** with ***. Provided verbal cues for***.  Gait Training:  Patient ambulated *** feet using *** with ***. Ambulated with ***. Provided verbal cues for ***.  Wheelchair Mobility:  Patient propelled wheelchair *** feet with ***. Provided verbal cues for ***  Neuromuscular Re-ed: Patient performed the following *** activities: ***  Therapeutic Exercise: Patient performed the following exercises with verbal and tactile cues for proper technique. ***  Patient in *** at end of session with breaks locked, *** alarm set, and all needs within reach.   Therapy Documentation Precautions:  Precautions Precautions: Fall,Other (comment) Precaution Comments: impulsive Restrictions Weight Bearing Restrictions: No General:   Vital Signs:   Pain:   Mobility:   Locomotion :    Trunk/Postural Assessment :    Balance:   Exercises:   Other Treatments:      Therapy/Group: Individual Therapy  Junius Faucett L Valda Christenson PT, DPT  09/19/2020, 6:18 AM

## 2020-09-19 NOTE — Progress Notes (Signed)
Occupational Therapy TBI Note  Patient Details  Name: Allen [name] MRN: [id number] Date of Birth: 12-May-1986  Today's Date: 09/19/2020 OT Individual Time: 0900-1000 OT Individual Time Calculation (min): 60 min    Short Term Goals: Week 1:  OT Short Term Goal 1 (Week 1): Pt will demo improved awareness by requiring only min cueing for impulsivity during toileting OT Short Term Goal 2 (Week 1): Pt will complete toileting tasks with CGA OT Short Term Goal 3 (Week 1): Pt will attend to grooming tasks with no more than min cueing  Skilled Therapeutic Interventions/Progress Updates:    Pt received sitting up in enclosure bed with NT present. Pt calm, oriented to place, generally situation, but stating incorrect year. Pt with poor awareness re deficits. Pt perseverative on leaving throughout entire session. Pt also with delusions re making money stating he just made "$[phone]" SEVERAL times. Pt unable to be redirected from very generalized threats of violence toward "anyone that messes with him" but not aggressive or threatening toward OT. Pt requested a ginger ale and one was provided to him, at nectar thick consistency. He reported drawing as a hobby he enjoys and he was provided with paper and markers. Mod cueing for initiation of task. Engaged pt in conversation with gentle reorientation to deficits and situation provided throughout, to increase tolerance for safe and appropriate staff interactions. Pt declined getting OOB despite several attempts by OT. Pt was able to draw picture while maintaining conversation with OT. Pt was left in enclosure bed secured, all needs met.   Therapy Documentation Precautions:  Precautions Precautions: Fall,Other (comment) Precaution Comments: impulsive Restrictions Weight Bearing Restrictions: No    Agitated Behavior Scale: TBI Observation Details Observation Environment: Patient room Start of observation period - Date: 09/19/20 Start of observation  period - Time: 0900 End of observation period - Date: 09/19/20 End of observation period - Time: 1000 Agitated Behavior Scale (DO NOT LEAVE BLANKS) Short attention span, easy distractibility, inability to concentrate: Present to a slight degree Impulsive, impatient, low tolerance for pain or frustration: Present to a slight degree Uncooperative, resistant to care, demanding: Present to a slight degree Violent and/or threatening violence toward people or property: Present to a slight degree Explosive and/or unpredictable anger: Absent Rocking, rubbing, moaning, or other self-stimulating behavior: Absent Pulling at tubes, restraints, etc.: Absent Wandering from treatment areas: Absent Restlessness, pacing, excessive movement: Absent Repetitive behaviors, motor, and/or verbal: Present to a moderate degree Rapid, loud, or excessive talking: Absent Sudden changes of mood: Absent Easily initiated or excessive crying and/or laughter: Absent Self-abusiveness, physical and/or verbal: Absent Agitated behavior scale total score: 20   Therapy/Group: Individual Therapy  Curtis Sites 09/19/2020, 11:01 AM

## 2020-09-20 ENCOUNTER — Inpatient Hospital Stay (HOSPITAL_COMMUNITY): Payer: Medicaid Other

## 2020-09-20 ENCOUNTER — Other Ambulatory Visit (HOSPITAL_COMMUNITY): Payer: Self-pay

## 2020-09-20 DIAGNOSIS — S069X1S Unspecified intracranial injury with loss of consciousness of 30 minutes or less, sequela: Secondary | ICD-10-CM

## 2020-09-20 MED ORDER — QUETIAPINE FUMARATE 100 MG PO TABS
100.0000 mg | ORAL_TABLET | Freq: Every day | ORAL | 0 refills | Status: DC
  Filled 2020-09-20: qty 30, 30d supply, fill #0

## 2020-09-20 MED ORDER — OXYCODONE HCL 5 MG/5ML PO SOLN
5.0000 mg | Freq: Four times a day (QID) | ORAL | 0 refills | Status: DC | PRN
  Filled 2020-09-20: qty 30, 1d supply, fill #0

## 2020-09-20 MED ORDER — QUETIAPINE FUMARATE 50 MG PO TABS
50.0000 mg | ORAL_TABLET | Freq: Every evening | ORAL | 0 refills | Status: DC | PRN
  Filled 2020-09-20: qty 30, 30d supply, fill #0

## 2020-09-20 MED ORDER — RESOURCE THICKENUP CLEAR PO POWD
1.0000 | ORAL | 0 refills | Status: DC | PRN
  Filled 2020-09-20: qty 125, 3d supply, fill #0

## 2020-09-20 MED ORDER — ACETAMINOPHEN 325 MG PO TABS: 325.0000 mg | ORAL_TABLET | ORAL | Status: DC | PRN

## 2020-09-20 MED ORDER — DIVALPROEX SODIUM 500 MG PO DR TAB
500.0000 mg | DELAYED_RELEASE_TABLET | Freq: Three times a day (TID) | ORAL | 0 refills | Status: DC
  Filled 2020-09-20: qty 90, 30d supply, fill #0

## 2020-09-20 NOTE — Progress Notes (Signed)
Modified Barium Swallow Progress Note  Patient Details  Name: Allen [name] MRN: [id number] Date of Birth: [birth year]  Today's Date: 09/20/2020  Modified Barium Swallow completed.  Full report located under Chart Review in the Imaging Section.  Brief recommendations include the following:  Clinical Impression  Patient demonstrates a mild oropharyngeal dysphagia impacted by cognitive deficits. Patient required Max encouragement for PO intake and overall participation resulting in prolonged mastication and delayed oral transit of regular textures due to poor attention to task. Patient consistently triggered his swallow at the level of the velleculae with thin liquids without penetration or aspiration noted. However, SLP unable to truly challenge patient due to minimal participation. Recommend patient upgrade to regular textures with thin liquids with medications whole in puree with full supervision.   Swallow Evaluation Recommendations       SLP Diet Recommendations: Regular solids;Thin liquid   Liquid Administration via: Straw;Cup   Medication Administration: Whole meds with puree   Supervision: Patient able to self feed;Full supervision/cueing for compensatory strategies   Compensations: Minimize environmental distractions;Small sips/bites;Slow rate       Oral Care Recommendations: Oral care BID        Rodina Pinales 09/20/2020,12:25 PM

## 2020-09-20 NOTE — Patient Care Conference (Signed)
Inpatient RehabilitationTeam Conference and Plan of Care Update Date: 09/20/2020   Time: 10:28 AM    Patient Name: Allen [name]      Medical Record Number: [id number]  Date of Birth: Sep 30, 1985 Sex: Male         Room/Bed: 4W12C/4W12C-02 Payor Info: Payor: /    Admit Date/Time:  09/12/2020  2:43 PM  Primary Diagnosis:  TBI (traumatic brain injury) [facility])  Hospital Problems: Principal Problem:   TBI (traumatic brain injury) [facility])    Expected Discharge Date: Expected Discharge Date: 09/20/20  Team Members Present: Physician leading conference: Dr. Faith Rogue Care Coodinator Present: Cecile Sheerer, LCSWA;Chai Verdejo Marlyne Beards, RN, BSN, CRRN Nurse Present: Kennyth Arnold, RN PT Present: Serina Cowper, PT OT Present: Jake Shark, OT PPS Coordinator present : Fae Pippin, SLP     Current Status/Progress Goal Weekly Team Focus  Bowel/Bladder   pt currently cont of B/B. LBM 09/13/20  PT is cont of B/B with regularity.  Offer tolieting for pt Q2h and provide bowel regiment options for contipation   Swallow/Nutrition/ Hydration   Regular textures and thin liquids, Supervision  Mod I  Family Education   ADL's   RLAS IV-V, agitation and perseveration limiting participation, promoting appropriate staff interactions and tolerance of multi-sensory environment  supervision  ADLs, transfers, cognitive training, agitation management   Mobility   Min A overall, gait >500 ft with ataxia, limited by intermittent lethargy, decreased attention, poor frustration tolerance, and perseveration  Supervision overall  Attention, safety awareness, functional mobility, activity tolerance, balance, gait and stair training, behavior management, patient/caregiver education   Communication             Safety/Cognition/ Behavioral Observations  Max A  Min A  Family education   Pain   Pt states pain at 0 out of 10 on pain sale.  Pt remains pain free  Assess for pain qshift and offer pain relieving  methods PRN   Skin   Surical incision site on L head OTA.  Skin is clean dry and intact and free of injury or infection  Assess skin qshift and provide skin or wound care PRN     Discharge Planning:  Pt is uninsured. Pt to return home with wife.   Team Discussion: Patient still has intermittent agitation, doesn't like enclosure bed. Fatigues easily, reducing medications. Has history of agitation, and anger management issues. Continent B/B, no pain reported, incision CDI. Wife plans to take patient home today. Patient on target to meet rehab goals: RLAS IV-V, agitation and perseveration limits participation. Has supervision goals for ADL's. Min assist overall, >581ft with ataxia. Limited by intermittent lethargy, decreased attention. Supervision goals overall.   *See Care Plan and progress notes for long and short-term goals.   Revisions to Treatment Plan:  Patient discharging today.  Teaching Needs: Family education, medication management, skin/wound care, transfer training, gait training, balance training, endurance training, safety awareness.  Current Barriers to Discharge: Decreased caregiver support, Medical stability, Home enviroment access/layout, Wound care, Lack of/limited family support, Medication compliance and Behavior  Possible Resolutions to Barriers: Continue current medications, provide emotional support.     Medical Summary Current Status: still with intermittent agitation. most often centers around going back in enclosure bed. fatigues easily, meds reduced which could be contributing. history of baseline agitation, anger mgt issues. ?bipolar  Barriers to Discharge: Behavior   Possible Resolutions to [employer] Focus: family education, home/outpt supports,therapy,f/u. will allow to leave today   Continued Need for Acute Rehabilitation Level of Care: The  patient requires daily medical management by a physician with specialized training in physical medicine and  rehabilitation for the following reasons: Direction of a multidisciplinary physical rehabilitation program to maximize functional independence : Yes Medical management of patient stability for increased activity during participation in an intensive rehabilitation regime.: Yes Analysis of laboratory values and/or radiology reports with any subsequent need for medication adjustment and/or medical intervention. : Yes   I attest that I was present, lead the team conference, and concur with the assessment and plan of the team.   Tennis Must 09/20/2020, 2:54 PM
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
## 2020-09-20 NOTE — Progress Notes (Signed)
Physical Therapy Discharge Summary  Patient Details  Name: Allen [name] MRN: [id number] Date of Birth: 1985-07-28  Today's Date: 09/20/2020 PT Individual Time: 0930-1000 PT Individual Time Calculation (min): 30 min    Patient has met 0 of 11 long term goals. Patient to discharge at ambulatory level for home mobility and wheelchair level for community mobility Min Assist.   Patient's care partner is independent to provide the necessary physical assistance at discharge.   Reasons goals not met: Patient's wife requesting early d/c despite education on benefits of continued inpatient rehab. Educated about goals for improved cognitive and functional recovery and family education to increase home safety and reduce fall risk prior to d/c. Patient's wife is aware that the patient will not meet any PT goals and demonstrated ability to physically assist him with transfers, ambulation, and stairs with current level of assist during 1 hands-on training session.    Recommendation:  Patient will benefit from ongoing skilled PT services in outpatient setting to continue to advance safe functional mobility, address ongoing impairments in balance, functional mobility, activity tolerance, gait and stair training, safety awareness, attention, frustration tolerance, dual task training, path finding, and minimize fall risk.  Equipment: 16"x16" wheelchair  Reasons for discharge: discharge from hospital and patient's wife's request  Patient/family agrees with progress made and goals achieved: Yes  PT Discharge Precautions/Restrictions Precautions Precautions: Fall;Other (comment) Precaution Comments: impulsive Restrictions Weight Bearing Restrictions: No Vision/Perception  Vision - Assessment Eye Alignment: Impaired (comment) (R exophoria) Ocular Range of Motion: Within Functional Limits Alignment/Gaze Preference: Within Defined Limits Tracking/Visual Pursuits: Able to track stimulus in all quads  without difficulty;Right eye does not track medially Saccades: Within functional limits Perception Perception: Impaired Inattention/Neglect: Does not attend to right visual field (attention versus visual deficits) Praxis Praxis: Impaired Praxis Impairment Details: Motor planning;Perseveration  Cognition Overall Cognitive Status: Impaired/Different from baseline Arousal/Alertness: Awake/alert Orientation Level: Oriented to person;Oriented to place;Oriented to situation Attention: Sustained Focused Attention: Impaired Focused Attention Impairment: Verbal basic;Functional basic Sustained Attention: Impaired Sustained Attention Impairment: Verbal basic;Functional basic Memory: Impaired Memory Impairment: Decreased recall of new information;Decreased short term memory Decreased Short Term Memory: Verbal basic Awareness: Impaired Awareness Impairment: Intellectual impairment Problem Solving: Impaired Problem Solving Impairment: Functional basic Executive Function: Reasoning;Initiating;Self Monitoring;Self Correcting;Decision Making (all impaired) Reasoning: Impaired Reasoning Impairment: Functional basic;Verbal basic Organizing: Impaired Decision Making: Impaired Decision Making Impairment: Verbal basic Initiating: Impaired Initiating Impairment: Functional basic Self Monitoring: Impaired Self Monitoring Impairment: Verbal basic;Functional basic Self Correcting: Impaired Self Correcting Impairment: Verbal basic;Functional basic Behaviors: Impulsive;Verbal agitation;Restless;Perseveration;Poor frustration tolerance;Agitated Behavior Scale;Lability Safety/Judgment: Impaired Comments: Impulsive, delusional and confused [employer] Scales of Cognitive Functioning: Confused/inappropriate/non-agitated Sensation Sensation Light Touch: Appears Intact (unable to participate in formal testing due to cognitive deficits, reports normal sensation to light touch and  pain/pressure) Proprioception: Impaired by gross assessment Coordination Gross Motor Movements are Fluid and Coordinated: No Fine Motor Movements are Fluid and Coordinated: Yes Coordination and Movement Description: Patietn able to don his tennis shoes independently, poor coordination with ataxia of lower extremities Heel Shin Test: slow and deliberate Motor  Motor Motor: Ataxia;Abnormal postural alignment and control  Mobility Bed Mobility Bed Mobility: Rolling Right;Rolling Left;Supine to Sit;Sit to Supine Rolling Right: Independent Rolling Left: Independent Supine to Sit: Independent Sit to Supine: Independent Transfers Transfers: Sit to Stand;Stand to Sit;Stand Pivot Transfers Sit to Stand: Contact Guard/Touching assist Stand to Sit: Contact Guard/Touching assist Stand Pivot Transfers: Contact Guard/Touching assist Transfer (Assistive device): None Locomotion  Gait Ambulation: Yes Gait Assistance:  Minimal Assistance - Patient > 75% Gait Distance (Feet): 1000 Feet Assistive device: 1 person hand held assist Gait Assistance Details: Verbal cues for gait pattern;Verbal cues for precautions/safety;Manual facilitation for weight shifting;Verbal cues for technique Gait Gait: Yes Gait Pattern: Decreased stride length;Lateral hip instability;Ataxic;Lateral trunk lean to left;Narrow base of support;Poor foot clearance - right;Poor foot clearance - left Gait velocity: reduced Stairs / Additional Locomotion Stairs: Yes Stairs Assistance: Contact Guard/Touching assist;Minimal Assistance - Patient > 75% Stair Management Technique: Two rails Number of Stairs: 4 Height of Stairs: 6 Wheelchair Mobility Wheelchair Mobility: Yes Wheelchair Assistance: Development worker, international aid: Both upper extremities Wheelchair Parts Management: Needs assistance  Trunk/Postural Assessment  Cervical Assessment Cervical Assessment: Within Tax adviser Assessment: Within Functional Limits Lumbar Assessment Lumbar Assessment: Within Functional Limits Postural Control Postural Control: Deficits on evaluation (decreased/delayed)  Balance Balance Balance Assessed: Yes Static Sitting Balance Static Sitting - Balance Support: No upper extremity supported;Feet supported Static Sitting - Level of Assistance: 7: Independent Dynamic Sitting Balance Dynamic Sitting - Balance Support: No upper extremity supported Dynamic Sitting - Level of Assistance: 7: Independent Static Standing Balance Static Standing - Balance Support: No upper extremity supported;During functional activity Static Standing - Level of Assistance: 5: Stand by assistance Dynamic Standing Balance Dynamic Standing - Balance Support: No upper extremity supported;During functional activity Dynamic Standing - Level of Assistance: 4: Min assist Extremity Assessment  RUE Assessment RUE Assessment: Within Functional Limits LUE Assessment LUE Assessment: Within Functional Limits RLE Assessment RLE Assessment: Within Functional Limits General Strength Comments: WFL for all functional mobility RLE Strength RLE Overall Strength Comments: Grossly 4+ to 5/5 throughout with functional mobility LLE Assessment LLE Assessment: Within Functional Limits Active Range of Motion (AROM) Comments: WFL for all functional mobility General Strength Comments: Grossly 4+ to 5/5 throughout with functional mobility    Hosanna Betley L Emillia Weatherly PT, DPT  09/20/2020, 4:18 PM

## 2020-09-20 NOTE — Progress Notes (Signed)
Occupational Therapy TBI Note  Patient Details  Name: Allen [name] MRN: [id number] Date of Birth: 1985-12-03  Today's Date: 09/20/2020 OT Individual Time: 1120-1145 OT Individual Time Calculation (min): 25 min  30 min missed d/t pt refusal    Short Term Goals: Week 1:  OT Short Term Goal 1 (Week 1): Pt will demo improved awareness by requiring only min cueing for impulsivity during toileting OT Short Term Goal 2 (Week 1): Pt will complete toileting tasks with CGA OT Short Term Goal 3 (Week 1): Pt will attend to grooming tasks with no more than min cueing  Skilled Therapeutic Interventions/Progress Updates:    Pt supine in enclosure bed with no c/o pain. Discussed upcoming d/c, with OT providing several education sheets re TBI and behavior/personality changes and confusion. Pt read through these sheets out loud while supine. Provided discussion and edu re TBI and safety at home. Pt with very poor insight to deficits. He expressed intent on return to driving and substance abuse immediately upon return home despite OT education to NOT. Pt also stated "She better not do anything or she'll get what's coming to her" in re to his wife when OT brought up anger issues pt dealt with PTA. Pt declined any OOB participation in ADLs. He was left in enclosure bed with all needs met.   Therapy Documentation Precautions:  Precautions Precautions: Fall,Other (comment) Precaution Comments: impulsive Restrictions Weight Bearing Restrictions: No General: General OT Amount of Missed Time: 20 Minutes  Agitated Behavior Scale: TBI Observation Details Observation Environment: Patient room Start of observation period - Date: 09/20/20 Start of observation period - Time: 1120 End of observation period - Date: 09/20/20 End of observation period - Time: 1145 Agitated Behavior Scale (DO NOT LEAVE BLANKS) Short attention span, easy distractibility, inability to concentrate: Present to a moderate  degree Impulsive, impatient, low tolerance for pain or frustration: Present to a slight degree Uncooperative, resistant to care, demanding: Present to a moderate degree Violent and/or threatening violence toward people or property: Present to a slight degree Explosive and/or unpredictable anger: Absent Rocking, rubbing, moaning, or other self-stimulating behavior: Absent Pulling at tubes, restraints, etc.: Absent Wandering from treatment areas: Absent Restlessness, pacing, excessive movement: Absent Repetitive behaviors, motor, and/or verbal: Present to a slight degree Rapid, loud, or excessive talking: Absent Sudden changes of mood: Absent Easily initiated or excessive crying and/or laughter: Absent Self-abusiveness, physical and/or verbal: Absent Agitated behavior scale total score: 21    Therapy/Group: Individual Therapy  Curtis Sites 09/20/2020, 12:03 PM
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
## 2020-09-20 NOTE — Progress Notes (Signed)
Occupational Therapy Discharge Summary  Patient Details  Name: Allen [name] MRN: [id number] Date of Birth: May 17, 1986  Today's Date: 09/20/2020 OT Individual Time: 1300-1340 OT Individual Time Calculation (min): 40 min    Patient has met 1 of 8 long term goals due to improved balance.  Patient to discharge at [facility] Assist level.  Patient's care partner is independent to provide the necessary physical and cognitive assistance at discharge. Pt remains highly confused, delusional, and a high fall risk. He has deficits in self awareness and insights of deficits.   Reasons goals not met: Pt's wife is requesting an earlier than recommended d/c. She is aware that he has not met his OT goals and is comfortable with this d/c. No hands on OT education has been completed.   Recommendation:  Patient will benefit from ongoing skilled OT services in outpatient setting to continue to advance functional skills in the area of BADL.  Equipment: No equipment provided  Reasons for discharge: Pt's wife is requesting he d/c  Patient/family agrees with progress made and goals achieved: Yes   OT Intervention: Pt supine in enclosure bed, ready for lunch. He transferred out of bed and to chair with CGA, impulsive in transfer. Pt was able to set up and consume lunch with supervision. He continues to demonstrate reduced awareness into deficits. Pt also at danger for manipulation of others as he states "all this money is coming to him" and shows OT very obvious scam email. Encouraged pt to consult with others before making any financial decisions. Pt consumed his lunch with no s/s of aspiration. He managed bite size appropriately as well. RN entered room to take over supervision of pt sitting OOB.   OT Discharge Precautions/Restrictions  Precautions Precautions: Fall;Other (comment) Precaution Comments: impulsive Restrictions Weight Bearing Restrictions: No General OT Amount of Missed Time: 20  Minutes Pain Pain Assessment Pain Scale: 0-10 Pain Score: 0-No pain ADL ADL Eating: Supervision/safety Grooming: Supervision/safety Upper Body Bathing: Not assessed Lower Body Bathing: Not assessed Upper Body Dressing: Modified independent (Device) Where Assessed-Upper Body Dressing: Edge of bed Lower Body Dressing: Minimal assistance Where Assessed-Lower Body Dressing: Edge of bed Toileting: Minimal assistance Where Assessed-Toileting: Glass blower/designer: Therapist, music Method: Actuary: Not assessed Social research officer, government: Not assessed Vision Baseline Vision/History: No visual deficits Patient Visual Report: No change from baseline Vision Assessment?: Vision impaired- to be further tested in functional context (pt still unable to participate in formal testing) Perception  Perception: Impaired Praxis Praxis: Impaired Praxis Impairment Details: Motor planning;Perseveration Cognition Overall Cognitive Status: Impaired/Different from baseline Arousal/Alertness: Awake/alert Orientation Level: Oriented to person;Oriented to place;Oriented to situation Attention: Sustained Sustained Attention: Impaired Sustained Attention Impairment: Verbal basic;Functional basic Memory: Impaired Memory Impairment: Decreased recall of new information;Decreased short term memory Decreased Short Term Memory: Verbal basic Awareness: Impaired Awareness Impairment: Intellectual impairment Problem Solving: Impaired Problem Solving Impairment: Functional basic Executive Function:  (all impaired) Organizing: Impaired Behaviors: Impulsive;Verbal agitation;Restless;Perseveration;Poor frustration tolerance;Agitated Behavior Scale Safety/Judgment: Impaired Comments: Impulsive, delusional and confused [employer] Scales of Cognitive Functioning: Confused/inappropriate/non-agitated Sensation Sensation Light Touch: Appears Intact Proprioception:  Impaired by gross assessment Coordination Gross Motor Movements are Fluid and Coordinated: No Fine Motor Movements are Fluid and Coordinated: Yes Motor  Motor Motor: Ataxia;Abnormal postural alignment and control Mobility  Bed Mobility Bed Mobility: Rolling Right;Rolling Left;Supine to Sit;Sit to Supine Rolling Right: Independent Rolling Left: Independent Supine to Sit: Independent Sit to Supine: Independent Transfers Sit to Stand: Contact Guard/Touching assist  Stand to Sit: Contact Guard/Touching assist  Trunk/Postural Assessment  Cervical Assessment Cervical Assessment: Within Functional Limits Thoracic Assessment Thoracic Assessment: Within Functional Limits Lumbar Assessment Lumbar Assessment: Within Functional Limits Postural Control Postural Control: Deficits on evaluation (decreased/delayed)  Balance Balance Balance Assessed: Yes Static Sitting Balance Static Sitting - Balance Support: No upper extremity supported;Feet supported Static Sitting - Level of Assistance: 7: Independent Dynamic Sitting Balance Dynamic Sitting - Balance Support: No upper extremity supported Dynamic Sitting - Level of Assistance: 7: Independent Static Standing Balance Static Standing - Balance Support: No upper extremity supported;During functional activity Static Standing - Level of Assistance: 5: Stand by assistance Dynamic Standing Balance Dynamic Standing - Balance Support: No upper extremity supported;During functional activity Dynamic Standing - Level of Assistance: 4: Min assist Extremity/Trunk Assessment RUE Assessment RUE Assessment: Within Functional Limits LUE Assessment LUE Assessment: Within Functional Limits   Curtis Sites 09/20/2020, 12:46 PM
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
## 2020-09-20 NOTE — Progress Notes (Addendum)
Patient ID: [place] [name], male   DOB: Sep 20, 1985, 35 y.o.   MRN: [id number]  SW received message from pt wife with concerns related to being on the phone with pt since 6:30am and he continued to request to go to the bathroom,and no one came. Also reports he complained of being hungry, and wile the tray was there, no one was available to sit in the room with him. SW spoke with charge RN was aware of concerns. SW spoke with Dr. Riley Kill with regard to wife's concerns. Reports he can discharge today. SW informed medical team, and waiting on d/c recommendations.   SW spoke with pt wife Deidra to discuss message received, and inform that her husband can d.c today. SW to follow-up once final recommendations have been entered. SW discussed delay due to unexpected discharge. She understands. She will take him home today. Anticipated to be here after 2pm.   D/c recommendations are HHPT/OT/SLP if able to obtain since charity, if not outpatient therapies; DME- w/c.   SW spoke with pt wife Deidra to discuss charity HHPT/OT/SLp and waiting on update from [facility] on if they can accept. SW explained there is a financial screen in which she will be contacted. If the is not accepted under charity HH, SW offered outpatient therapies as an option. SW reiterated she would not need to establish a payment arrangement plan with [facility]. SW also explained charity DME with [facility] for a w/c which they do not have. She asked if a rollator would be safer for pt. SW explained due to him being unsteady on his feet, and impulsive likely not the best option if he forgets to lock the brakes on the rollator and falls. Discussed [facility] medication assistance program.  SW received updates from [employer] (charity organization this week) to discuss referral, and reports referral declined as they only have SN in [place] area.   SW returned phone call to pt mother Stann Ore [phone]) to inform on pt d/c today  due to wife's dissatisfaction with care. Reports she had not spoken with her, but intends to follow-up. SW informed on above with regard to services we will put in place.   SW called pt wife to inform on above. She reported the w/c will be delivered to the home since they are out of stock. She is amenable to outpatient at [facility] for PT/OT/SLP.  SW tried faxed outpatient PT/OT/SLP order to [facility] (p:[phone]/f:[phone]) but issues. SW was informed that the ambulatory referral was received, and SW did not need to fax over order.   *SW later sent pt wife HEP information from PT/OT/SLP. She reported that the DME was delivered when she arrived home. She stated she did not receive a financial assistance application. SW advised her to call [employer] [facility].   Cecile Sheerer, MSW, LCSWA Office: [phone] Cell: [phone] Fax: [phone]

## 2020-09-20 NOTE — Progress Notes (Signed)
Physical Therapy TBI Note  Patient Details  Name: [place] [name] MRN: [id number] Date of Birth: [birth year]  Today's Date: 09/20/2020 PT Individual Time: 0930-1000 PT Individual Time Calculation (min): 30 min   Short Term Goals: Week 1:  PT Short Term Goal 1 (Week 1): Patient will perform basic transfers with CGA consistently. PT Short Term Goal 2 (Week 1): Patient will ambulate >150 ft with min A using LRAD. PT Short Term Goal 3 (Week 1): Patient will participate in one standardized balance test.  Skilled Therapeutic Interventions/Progress Updates:     Patient sitting EOB with SLP upon PT arrival. Patient alert and agreeable to PT session with focus on ambulating down to Radiology for swallow study with SLP. Patient denied pain during session.  Patient was very perseverative on his phone, going home, going out to eat, throughout session. Required significant redirection and cues to initiate tasks  Therapeutic Activity: Transfers: Patient performed sit to/from stand x1 and stand pivot x2 with CGA-supervision. Provided verbal cues for safety awareness and use of arm rests during transfers for balance.  Gait Training:  Patient ambulated >1000 feet using from his room to radiology stepping over thresholds and getting on/off the elevator with min A with arm over therapist's shoulder. Ambulated with mild ataxic gait, decreased B foot clearance with toe catching on L x2. Provided verbal cues for erect posture, increased foot clearance and increased gait speed.  Wheelchair Mobility:  Patient was transported in a transport chair back to his room following swallow study with total A due to patient refusal to ambulate back due to fatigue. Unable to redirect with max encouragement.  RN arrived on return to the room to administer medications. Provided max cues and encouragement to take medications without success after >15 min of attempts due to patient refusal, delayed initiation, and decreased  attention.  Patient in recliner sitting with RN at nursing station at end of session with breaks locked, chair alarm set, and all needs within reach.   Put together d/c instructions for exercise progression and home safety and provided to CSW to pass on to his wife.   Therapy Documentation Precautions:  Precautions Precautions: Fall,Other (comment) Precaution Comments: impulsive Restrictions Weight Bearing Restrictions: No Agitated Behavior Scale: TBI Observation Details Observation Environment: Patient room Start of observation period - Date: 09/20/20 Start of observation period - Time: 1120 End of observation period - Date: 09/20/20 End of observation period - Time: 1145 Agitated Behavior Scale (DO NOT LEAVE BLANKS) Short attention span, easy distractibility, inability to concentrate: Present to a moderate degree Impulsive, impatient, low tolerance for pain or frustration: Present to a slight degree Uncooperative, resistant to care, demanding: Present to a moderate degree Violent and/or threatening violence toward people or property: Present to a slight degree Explosive and/or unpredictable anger: Absent Rocking, rubbing, moaning, or other self-stimulating behavior: Absent Pulling at tubes, restraints, etc.: Absent Wandering from treatment areas: Absent Restlessness, pacing, excessive movement: Absent Repetitive behaviors, motor, and/or verbal: Present to a slight degree Rapid, loud, or excessive talking: Absent Sudden changes of mood: Absent Easily initiated or excessive crying and/or laughter: Absent Self-abusiveness, physical and/or verbal: Absent Agitated behavior scale total score: 21   Therapy/Group: Individual Therapy  Tyqwan Pink L Lasean Rahming PT, DPT  09/20/2020, 12:32 PM

## 2020-09-21 NOTE — Progress Notes (Signed)
Inpatient Rehabilitation Care Coordinator Discharge Note  The overall goal for the admission was met for:   Discharge location: Yes. D/c to home with 24/7 care from pt wife.   Length of Stay: Yes. 8 days.   Discharge activity level: Yes. Min A  Home/community participation: Yes. Limited.   Services provided included: MD, RD, PT, OT, SLP, RN, CM, TR, Pharmacy, Neuropsych and SW  Financial Services: Other: Uninsured  Choices offered to/list presented to:Yes  Follow-up services arranged: Outpatient: [facility] for PT/OT/SLP outpatient therapies and DME: [facility] for w/c (charity)  Comments (or additional information): Follow-up with [facility] [phone] to screened for [facility] [phone] to establish PCP   Patient/Family verbalized understanding of follow-up arrangements: Yes  Individual responsible for coordination of the follow-up plan: contact pt wife Deidra [phone]  Confirmed correct DME delivered: Avani Sensabaugh A Carlette Palmatier 09/21/2020    Jamayah Myszka A Keyvin Rison

## 2020-09-21 NOTE — Progress Notes (Signed)
Speech Language Pathology Discharge Summary  Patient Details  Name: [place] [name] MRN: [id number] Date of Birth: 1986-03-27  Patient has met 1 of 7 long term goals.  Patient to discharge at overall Max level.   Reasons goals not met: Patient's wife requesting early d/c despite education on benefits of continued inpatient rehab. Patient's wife is aware that the patient will not meet any SLP goals and no formal education was provided. However, SLP did provide handouts and a cognitive home program.   Clinical Impression/Discharge Summary: Patient has made minimal gains and has met 1 of 7 LTGs this admission. Today, with Max encouragement, patient participated in an abbreviated repeat MBS. No penetration or aspiration noted with few sips of thin liquids via cup or straw, therefore, patient was upgraded to regular textures with thin liquids. Patient continues to demonstrate behaviors consistent with a Rancho level V and requires overall Max A multimodal cues for initiation, sustained attention, emergent awareness, recall and overall safety with basic, functional tasks. Patient is discharging early from CIR per wife's request and no formal education has been completed but handouts were provided. Patient would benefit from f/u SLP services to maximize his cognitive  functioning and overall functional independence in order to reduce caregiver burden.   Care Partner:  Caregiver Able to Provide Assistance: Yes  Type of Caregiver Assistance: Physical;Cognitive  Recommendation:  24 hour supervision/assistance;Outpatient SLP Rationale for SLP Follow Up: Reduce caregiver burden;Maximize cognitive function and independence;Maximize swallowing safety   Equipment: N/A   Reasons for discharge: Discharged from hospital   Patient/Family Agrees with Progress Made and Goals Achieved: Yes    [place], [place] 09/21/2020, 6:14 AM

## 2020-09-21 NOTE — Discharge Instructions (Signed)
  Inpatient Rehab Discharge Instructions  [facility] Discharge date and time: No discharge date for patient encounter.   Activities/Precautions/ Functional Status: Activity: activity as tolerated Diet: Regular Wound Care: Routine skin checks Functional status:  ___ No restrictions     ___ Walk up steps independently ___ 24/7 supervision/assistance   ___ Walk up steps with assistance ___ Intermittent supervision/assistance  ___ Bathe/dress independently ___ Walk with walker     _x__ Bathe/dress with assistance ___ Walk Independently    ___ Shower independently ___ Walk with assistance    ___ Shower with assistance ___ No alcohol     ___ Return to work/school ________  COMMUNITY REFERRALS UPON DISCHARGE:    Outpatient: PT     OT   ST             Agency: [facility] Phone: [phone]             Appointment Date/Time:*Please expect follow-up within 7-10 business days to schedule appointment. If you do not receive follow-up, be sure to contact site directly. Also, be sure to discuss financial assistance at time of appointment.*  Medical Equipment/Items Ordered: wheelchair (charity)                                                 Agency/Supplier: [facility] [phone]  GENERAL COMMUNITY RESOURCES FOR PATIENT/FAMILY: 1) Please be sure to follow-up with [facility] #[phone] to screened for [facility] [phone].   Special Instructions:  No driving smoking or alcohol  My questions have been answered and I understand these instructions. I will adhere to these goals and the provided educational materials after my discharge from the hospital.  Patient/Caregiver Signature _______________________________ Date __________  Clinician Signature _______________________________________ Date __________  Please bring this form and your medication list with you to all your follow-up doctor's appointments.

## 2020-09-22 ENCOUNTER — Telehealth: Payer: Self-pay | Admitting: *Deleted

## 2020-09-22 NOTE — Telephone Encounter (Signed)
[place] [name] wife said his feet are swollen. I instructed him to keep elevated. He has an appt with you next Wednesday.

## 2020-09-22 NOTE — Telephone Encounter (Signed)
Transitional Care call--[name]    1. Are you/is patient experiencing any problems since coming home? Are there any questions regarding any aspect of care? NO except his feet are swollen. C/o feet hurting some but no obvious pain in calves or redness/swelling there. Advised to keep elevated when sitting and at night. 2. Are there any questions regarding medications administration/dosing? Are meds being taken as prescribed? Patient should review meds with caller to confirm Reviewed all meds being taken as prescribed. 3. Have there been any falls? NO 4. Has Home Health been to the house and/or have they contacted you? If not, have you tried to contact them? Can we help you contact them? N/A  PT/OT/ST outpt therapy arranged with [facility] 5. Are bowels and bladder emptying properly? Are there any unexpected incontinence issues? If applicable, is patient following bowel/bladder programs?No Problems 6. Any fevers, problems with breathing, unexpected pain?NO 7. Are there any skin problems or new areas of breakdown? NO 8. Has the patient/family member arranged specialty MD follow up (ie cardiology/neurology/renal/surgical/etc)?  Can we help arrange? Appt with Riley Kill  Given. She is going to call Neurosurgeon today. 9. Does the patient need any other services or support that we can help arrange? NO 10. Are caregivers following through as expected in assisting the patient? Yes 11. Has the patient quit smoking, drinking alcohol, or using drugs as recommended? Yes  Appointment Wednesday 09/28/20 @11 :40 arrive by 11:20 to see Dr in mail, reminded to watch mailbox for packet from our office. Address reviewed.  [address]

## 2020-09-23 ENCOUNTER — Telehealth: Payer: Self-pay

## 2020-09-23 NOTE — Telephone Encounter (Signed)
There are no obvious reasons that this should be happening. I agree with elevation and he could also where compression stockings (knee high)

## 2020-09-24 ENCOUNTER — Emergency Department (HOSPITAL_COMMUNITY): Admission: EM | Admit: 2020-09-24 | Discharge: 2020-09-24 | Discharge status: Against Medical Advice | Payer: MEDICAID

## 2020-09-28 ENCOUNTER — Other Ambulatory Visit: Payer: Self-pay

## 2020-09-28 ENCOUNTER — Encounter: Payer: Self-pay | Admitting: Physical Medicine & Rehabilitation

## 2020-09-28 ENCOUNTER — Encounter: Payer: Medicaid Other | Attending: Physical Medicine & Rehabilitation | Admitting: Physical Medicine & Rehabilitation

## 2020-09-28 VITALS — BP 117/80 | HR 83 | Temp 98.7°F | Ht 70.0 in | Wt 151.0 lb

## 2020-09-28 DIAGNOSIS — R059 Cough, unspecified: Secondary | ICD-10-CM | POA: Diagnosis not present

## 2020-09-28 DIAGNOSIS — Z79899 Other long term (current) drug therapy: Secondary | ICD-10-CM | POA: Diagnosis not present

## 2020-09-28 DIAGNOSIS — R519 Headache, unspecified: Secondary | ICD-10-CM | POA: Diagnosis not present

## 2020-09-28 DIAGNOSIS — S069X0S Unspecified intracranial injury without loss of consciousness, sequela: Secondary | ICD-10-CM | POA: Diagnosis not present

## 2020-09-28 DIAGNOSIS — R410 Disorientation, unspecified: Secondary | ICD-10-CM | POA: Insufficient documentation

## 2020-09-28 DIAGNOSIS — S04011S Injury of optic nerve, right eye, sequela: Secondary | ICD-10-CM | POA: Diagnosis not present

## 2020-09-28 DIAGNOSIS — S065X3D Traumatic subdural hemorrhage with loss of consciousness of 1 hour to 5 hours 59 minutes, subsequent encounter: Secondary | ICD-10-CM | POA: Insufficient documentation

## 2020-09-28 DIAGNOSIS — S04011A Injury of optic nerve, right eye, initial encounter: Secondary | ICD-10-CM | POA: Insufficient documentation

## 2020-09-28 DIAGNOSIS — S069X3S Unspecified intracranial injury with loss of consciousness of 1 hour to 5 hours 59 minutes, sequela: Secondary | ICD-10-CM

## 2020-09-28 DIAGNOSIS — G8918 Other acute postprocedural pain: Secondary | ICD-10-CM | POA: Diagnosis not present

## 2020-09-28 DIAGNOSIS — R062 Wheezing: Secondary | ICD-10-CM | POA: Insufficient documentation

## 2020-09-28 DIAGNOSIS — S066X3D Traumatic subarachnoid hemorrhage with loss of consciousness of 1 hour to 5 hours 59 minutes, subsequent encounter: Secondary | ICD-10-CM | POA: Insufficient documentation

## 2020-09-28 DIAGNOSIS — R1312 Dysphagia, oropharyngeal phase: Secondary | ICD-10-CM | POA: Diagnosis not present

## 2020-09-28 DIAGNOSIS — Z09 Encounter for follow-up examination after completed treatment for conditions other than malignant neoplasm: Secondary | ICD-10-CM | POA: Insufficient documentation

## 2020-09-28 DIAGNOSIS — R490 Dysphonia: Secondary | ICD-10-CM | POA: Diagnosis not present

## 2020-09-28 DIAGNOSIS — R262 Difficulty in walking, not elsewhere classified: Secondary | ICD-10-CM | POA: Diagnosis not present

## 2020-09-28 DIAGNOSIS — F17210 Nicotine dependence, cigarettes, uncomplicated: Secondary | ICD-10-CM | POA: Diagnosis not present

## 2020-09-28 DIAGNOSIS — S04011D Injury of optic nerve, right eye, subsequent encounter: Secondary | ICD-10-CM | POA: Insufficient documentation

## 2020-09-28 DIAGNOSIS — F1910 Other psychoactive substance abuse, uncomplicated: Secondary | ICD-10-CM | POA: Diagnosis not present
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
## 2020-09-28 NOTE — Patient Instructions (Addendum)
PLEASE FEEL FREE TO CALL OUR OFFICE WITH ANY PROBLEMS OR QUESTIONS [phone])  FOR PAIN: TYLENOL UP TO 2500MG  DAILY  NAPROXEN 2 TABLETS TWICE DAILY AS NEEDED (or) IBUPROFEN 3 TABLETS 3 X DAILY.    START USING A CALENDAR OR ORGANIZER (YOU CAN USE YOUR PHONE) TO HELP WITH YOUR MEMORY AND ORGANIZATION.

## 2020-10-05 ENCOUNTER — Ambulatory Visit (HOSPITAL_COMMUNITY): Payer: Medicaid Other | Attending: Physician Assistant | Admitting: Physical Therapy

## 2020-10-05 ENCOUNTER — Ambulatory Visit (HOSPITAL_COMMUNITY): Payer: Medicaid Other

## 2020-10-05 ENCOUNTER — Encounter (HOSPITAL_COMMUNITY): Payer: Self-pay

## 2020-10-05 ENCOUNTER — Other Ambulatory Visit: Payer: Self-pay

## 2020-10-05 ENCOUNTER — Encounter (HOSPITAL_COMMUNITY): Payer: Self-pay | Admitting: Physical Therapy

## 2020-10-05 DIAGNOSIS — R2689 Other abnormalities of gait and mobility: Secondary | ICD-10-CM | POA: Insufficient documentation

## 2020-10-05 DIAGNOSIS — R29898 Other symptoms and signs involving the musculoskeletal system: Secondary | ICD-10-CM | POA: Insufficient documentation

## 2020-10-05 DIAGNOSIS — R41841 Cognitive communication deficit: Secondary | ICD-10-CM | POA: Insufficient documentation

## 2020-10-05 DIAGNOSIS — R262 Difficulty in walking, not elsewhere classified: Secondary | ICD-10-CM | POA: Diagnosis present

## 2020-10-05 DIAGNOSIS — M6281 Muscle weakness (generalized): Secondary | ICD-10-CM | POA: Insufficient documentation

## 2020-10-05 NOTE — Patient Instructions (Signed)
Access Code: [email or web address] URL: [email or web address] Date: 10/05/2020 Prepared by: Georges Lynch  Exercises Seated Heel Toe Raises - 3 x daily - 7 x weekly - 2 sets - 10 reps Seated Long Arc Quad - 3 x daily - 7 x weekly - 2 sets - 10 reps

## 2020-10-06 ENCOUNTER — Ambulatory Visit (HOSPITAL_COMMUNITY): Payer: Medicaid Other | Admitting: Speech Pathology

## 2020-10-06 ENCOUNTER — Encounter (HOSPITAL_COMMUNITY): Payer: Self-pay | Admitting: Speech Pathology

## 2020-10-06 DIAGNOSIS — R41841 Cognitive communication deficit: Secondary | ICD-10-CM

## 2020-10-06 DIAGNOSIS — R262 Difficulty in walking, not elsewhere classified: Secondary | ICD-10-CM | POA: Diagnosis not present
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
## 2020-10-06 NOTE — Progress Notes (Signed)
   10/05/20 1044  Assessment  Medical Diagnosis TBI  Referring Provider (PT) Mariam Dollar Pa-C  Onset Date/Surgical Date 08/26/20  Prior Therapy Yes  Restrictions  Weight Bearing Restrictions No  Home Environment  Living Environment Private residence  Prior Function  Level of Independence Independent  Cognition  Overall Cognitive Status Difficult to assess  Sensation  Light Touch Impaired by gross assessment (increased sensation about Rt ankle)  AROM  Overall AROM Comments Min/ mod decreased ankle DF bilateral, min decreased hip flexion bilateral  Strength  Left Knee Extension 4+/5  Right Hip Flexion 4+/5  Right Hip Extension 4+/5  Right Hip ABduction 4+/5  Left Hip Flexion 4/5  Left Hip ABduction 4+/5  Right Knee Extension 4+/5  Right Ankle Dorsiflexion 3-/5  Left Ankle Dorsiflexion 3-/5  Strength Assessment Site Knee;Hip;Ankle  Ambulation/Gait  Ambulation/Gait Yes  Ambulation/Gait Assistance 4: Min guard  Assistive device None  Gait Pattern Decreased stride length;Right flexed knee in stance;Left flexed knee in stance;Ataxic;Poor foot clearance - left;Poor foot clearance - right  Ambulation Surface Level;Indoor  Balance  Balance Assessed Yes  Static Standing Balance  Static Standing Balance -  Activities  Tandam Stance - Right Leg;Tandam Stance - Left Leg  Static Standing - Comment/# of Minutes <10 sec, 30 sec mod sway

## 2020-10-10 ENCOUNTER — Encounter (HOSPITAL_COMMUNITY): Payer: Self-pay | Admitting: Speech Pathology

## 2020-10-10 ENCOUNTER — Other Ambulatory Visit: Payer: Self-pay

## 2020-10-10 ENCOUNTER — Ambulatory Visit (HOSPITAL_COMMUNITY): Payer: Medicaid Other | Admitting: Speech Pathology

## 2020-10-10 DIAGNOSIS — R41841 Cognitive communication deficit: Secondary | ICD-10-CM

## 2020-10-10 DIAGNOSIS — R262 Difficulty in walking, not elsewhere classified: Secondary | ICD-10-CM | POA: Diagnosis not present
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
## 2020-10-11 ENCOUNTER — Ambulatory Visit (HOSPITAL_COMMUNITY): Payer: Medicaid Other

## 2020-10-11 ENCOUNTER — Encounter (HOSPITAL_COMMUNITY): Payer: Self-pay

## 2020-10-11 DIAGNOSIS — M6281 Muscle weakness (generalized): Secondary | ICD-10-CM

## 2020-10-11 DIAGNOSIS — R2689 Other abnormalities of gait and mobility: Secondary | ICD-10-CM

## 2020-10-11 DIAGNOSIS — R262 Difficulty in walking, not elsewhere classified: Secondary | ICD-10-CM
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
## 2020-10-13 ENCOUNTER — Ambulatory Visit (HOSPITAL_COMMUNITY): Payer: Medicaid Other | Admitting: Physical Therapy

## 2020-10-13 ENCOUNTER — Ambulatory Visit (HOSPITAL_COMMUNITY): Payer: Medicaid Other | Admitting: Speech Pathology

## 2020-10-13 ENCOUNTER — Telehealth (HOSPITAL_COMMUNITY): Payer: Self-pay | Admitting: Speech Pathology

## 2020-10-13 NOTE — Telephone Encounter (Signed)
Mom came in and he is not feeling like doing any therapy today- they requested to cx since he is in a bad mood.

## 2020-10-18 ENCOUNTER — Encounter (HOSPITAL_COMMUNITY): Payer: Self-pay | Admitting: Speech Pathology

## 2020-10-18 ENCOUNTER — Other Ambulatory Visit: Payer: Self-pay

## 2020-10-18 ENCOUNTER — Ambulatory Visit (HOSPITAL_COMMUNITY): Payer: Medicaid Other | Admitting: Speech Pathology

## 2020-10-18 DIAGNOSIS — R41841 Cognitive communication deficit: Secondary | ICD-10-CM

## 2020-10-18 DIAGNOSIS — R262 Difficulty in walking, not elsewhere classified: Secondary | ICD-10-CM | POA: Diagnosis not present
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
## 2020-10-20 ENCOUNTER — Encounter (HOSPITAL_COMMUNITY): Payer: Self-pay | Admitting: Speech Pathology

## 2020-10-20 ENCOUNTER — Other Ambulatory Visit: Payer: Self-pay

## 2020-10-20 ENCOUNTER — Ambulatory Visit (HOSPITAL_COMMUNITY): Payer: Medicaid Other | Attending: Physician Assistant | Admitting: Speech Pathology

## 2020-10-20 DIAGNOSIS — R29898 Other symptoms and signs involving the musculoskeletal system: Secondary | ICD-10-CM | POA: Insufficient documentation

## 2020-10-20 DIAGNOSIS — R2689 Other abnormalities of gait and mobility: Secondary | ICD-10-CM | POA: Insufficient documentation

## 2020-10-20 DIAGNOSIS — R262 Difficulty in walking, not elsewhere classified: Secondary | ICD-10-CM | POA: Diagnosis present

## 2020-10-20 DIAGNOSIS — R41841 Cognitive communication deficit: Secondary | ICD-10-CM | POA: Insufficient documentation

## 2020-10-20 DIAGNOSIS — M6281 Muscle weakness (generalized): Secondary | ICD-10-CM | POA: Diagnosis present
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
## 2020-10-21 ENCOUNTER — Ambulatory Visit (HOSPITAL_COMMUNITY): Payer: Medicaid Other | Admitting: Physical Therapy

## 2020-10-21 DIAGNOSIS — R41841 Cognitive communication deficit: Secondary | ICD-10-CM | POA: Diagnosis not present

## 2020-10-21 DIAGNOSIS — R262 Difficulty in walking, not elsewhere classified: Secondary | ICD-10-CM

## 2020-10-21 DIAGNOSIS — R2689 Other abnormalities of gait and mobility: Secondary | ICD-10-CM

## 2020-10-21 DIAGNOSIS — R29898 Other symptoms and signs involving the musculoskeletal system: Secondary | ICD-10-CM

## 2020-10-21 DIAGNOSIS — M6281 Muscle weakness (generalized): Secondary | ICD-10-CM
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
## 2020-10-25 ENCOUNTER — Other Ambulatory Visit: Payer: Self-pay

## 2020-10-25 ENCOUNTER — Encounter (HOSPITAL_COMMUNITY): Payer: Self-pay | Admitting: Speech Pathology

## 2020-10-25 ENCOUNTER — Encounter (HOSPITAL_COMMUNITY): Payer: Self-pay

## 2020-10-25 ENCOUNTER — Ambulatory Visit (HOSPITAL_COMMUNITY): Payer: Medicaid Other

## 2020-10-25 ENCOUNTER — Ambulatory Visit (HOSPITAL_COMMUNITY): Payer: Medicaid Other | Admitting: Speech Pathology

## 2020-10-25 DIAGNOSIS — R41841 Cognitive communication deficit: Secondary | ICD-10-CM

## 2020-10-25 DIAGNOSIS — M6281 Muscle weakness (generalized): Secondary | ICD-10-CM

## 2020-10-25 DIAGNOSIS — R262 Difficulty in walking, not elsewhere classified: Secondary | ICD-10-CM

## 2020-10-25 DIAGNOSIS — R2689 Other abnormalities of gait and mobility: Secondary | ICD-10-CM
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
## 2020-10-26 ENCOUNTER — Telehealth: Payer: Self-pay | Admitting: *Deleted

## 2020-10-26 ENCOUNTER — Telehealth: Payer: Self-pay | Admitting: Physical Medicine & Rehabilitation

## 2020-10-26 NOTE — Telephone Encounter (Signed)
Mr Beehler called requesting a referral to return to work. Please advise.

## 2020-10-26 NOTE — Telephone Encounter (Signed)
Given his ongoing deficits, he was not cleared to return to work. The earliest I would consider would be after his appt with me on 11/30/20

## 2020-10-26 NOTE — Telephone Encounter (Signed)
Patient needs return to work note with any restrictions. Need to know he is clear to start.

## 2020-10-27 ENCOUNTER — Encounter (HOSPITAL_COMMUNITY): Payer: Self-pay | Admitting: Physical Therapy

## 2020-10-27 ENCOUNTER — Ambulatory Visit (HOSPITAL_COMMUNITY): Payer: Medicaid Other | Admitting: Speech Pathology

## 2020-10-27 ENCOUNTER — Other Ambulatory Visit: Payer: Self-pay

## 2020-10-27 ENCOUNTER — Ambulatory Visit (HOSPITAL_COMMUNITY): Payer: Medicaid Other | Admitting: Physical Therapy

## 2020-10-27 DIAGNOSIS — M6281 Muscle weakness (generalized): Secondary | ICD-10-CM

## 2020-10-27 DIAGNOSIS — R262 Difficulty in walking, not elsewhere classified: Secondary | ICD-10-CM

## 2020-10-27 DIAGNOSIS — R2689 Other abnormalities of gait and mobility: Secondary | ICD-10-CM

## 2020-10-27 NOTE — Telephone Encounter (Signed)
Contacted the patient and let them know Dr Riley Kill would not be able to provide the note to return currently.  This will be re-evaluated once h

## 2020-10-27 NOTE — Therapy (Signed)
[facility] [address] [place], [place], [address] Phone: [phone]   Fax:  [phone]  Patient Details  Name: Allen [name] MRN: [id number] Date of Birth: February 13, 1986 Referring Provider:  Charlton Amor, PA-C  Encounter Date: 10/27/2020 Pt showed for appointment but left prior to being seen. Attempted to call but no answer.  Virgina Organ, PT CLT [phone]  10/27/2020, 11:36 AM  [facility] [address] [place], [place], [address] Phone: [phone]   Fax:  [phone]

## 2020-11-01 ENCOUNTER — Ambulatory Visit (HOSPITAL_COMMUNITY): Payer: Medicaid Other | Admitting: Physical Therapy

## 2020-11-01 ENCOUNTER — Ambulatory Visit (HOSPITAL_COMMUNITY): Payer: Medicaid Other | Admitting: Speech Pathology

## 2020-11-03 ENCOUNTER — Encounter (HOSPITAL_COMMUNITY): Payer: Medicaid Other | Admitting: Speech Pathology

## 2020-11-03 ENCOUNTER — Telehealth (HOSPITAL_COMMUNITY): Payer: Self-pay

## 2020-11-03 ENCOUNTER — Telehealth (HOSPITAL_COMMUNITY): Payer: Self-pay | Admitting: Speech Pathology

## 2020-11-03 ENCOUNTER — Ambulatory Visit (HOSPITAL_COMMUNITY): Payer: Medicaid Other

## 2020-11-03 NOTE — Telephone Encounter (Signed)
Telephone Call  SLP called Pt, however no answer, but his wife, Deirdre called back. She stated that Pt wanted to be finished with therapies (PT/SLP) at this time and is hoping to be released to return to work. SLP will alert PT and front office to cancel Pt.   Thank you,  Havery Moros, CCC-SLP [phone]

## 2020-11-03 NOTE — Telephone Encounter (Signed)
No show, called and spoke to wife who stated he was not available.  Informed her of missed apt today, reminded next apt date and time.  Educated on no show policy and given contact number if needs to cancel/reschedule.  Becky Sax, LPTA/CLT; Rowe Clack [phone]

## 2020-11-08 ENCOUNTER — Ambulatory Visit (HOSPITAL_COMMUNITY): Payer: Medicaid Other | Admitting: Physical Therapy

## 2020-11-10 ENCOUNTER — Ambulatory Visit (HOSPITAL_COMMUNITY): Payer: Medicaid Other | Admitting: Physical Therapy

## 2020-11-15 ENCOUNTER — Ambulatory Visit (HOSPITAL_COMMUNITY): Payer: Medicaid Other | Admitting: Physical Therapy

## 2020-11-17 ENCOUNTER — Ambulatory Visit (HOSPITAL_COMMUNITY): Payer: Medicaid Other | Admitting: Physical Therapy

## 2020-11-30 ENCOUNTER — Encounter: Payer: Self-pay | Admitting: Physical Medicine & Rehabilitation

## 2020-11-30 ENCOUNTER — Other Ambulatory Visit: Payer: Self-pay

## 2020-11-30 ENCOUNTER — Encounter: Payer: Medicaid Other | Attending: Physical Medicine & Rehabilitation | Admitting: Physical Medicine & Rehabilitation

## 2020-11-30 VITALS — BP 114/74 | HR 69 | Temp 98.5°F | Ht 70.0 in | Wt 156.6 lb

## 2020-11-30 DIAGNOSIS — S04011S Injury of optic nerve, right eye, sequela: Secondary | ICD-10-CM

## 2020-11-30 DIAGNOSIS — Z87820 Personal history of traumatic brain injury: Secondary | ICD-10-CM | POA: Diagnosis not present

## 2020-11-30 DIAGNOSIS — S069X3S Unspecified intracranial injury with loss of consciousness of 1 hour to 5 hours 59 minutes, sequela: Secondary | ICD-10-CM | POA: Diagnosis not present

## 2020-11-30 DIAGNOSIS — S069X0S Unspecified intracranial injury without loss of consciousness, sequela: Secondary | ICD-10-CM

## 2020-11-30 DIAGNOSIS — R269 Unspecified abnormalities of gait and mobility: Secondary | ICD-10-CM | POA: Insufficient documentation

## 2020-11-30 DIAGNOSIS — H539 Unspecified visual disturbance: Secondary | ICD-10-CM | POA: Insufficient documentation

## 2020-11-30 DIAGNOSIS — Z09 Encounter for follow-up examination after completed treatment for conditions other than malignant neoplasm: Secondary | ICD-10-CM | POA: Insufficient documentation

## 2020-11-30 DIAGNOSIS — R1312 Dysphagia, oropharyngeal phase: Secondary | ICD-10-CM
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
## 2020-11-30 NOTE — Patient Instructions (Signed)
SLEEP IS KEY. 8 HOURS + NIGHT   AVOID ALCOHOL AND ANY OTHER SUBSTANCES IF YOU WANT TO AVOID HAVING SEIZURES OR OTHER COMPLICATIONS RELATED TO YOUR BRAIN INJURY!

## 2020-12-07 ENCOUNTER — Encounter: Payer: Self-pay | Admitting: Physical Medicine & Rehabilitation

## 2020-12-07 NOTE — Telephone Encounter (Signed)
error 

## 2021-11-07 IMAGING — CT CT HEAD W/O CM
3 of 4 series · 15 of 47 positions shown, 18 images · non-contrast
Comparison: 08/28/2020

CLINICAL DATA: Traumatic brain injury

EXAM:
CT HEAD WITHOUT CONTRAST
TECHNIQUE: Contiguous axial images were obtained from the base of the skull
through the vertex without intravenous contrast.

[Series 4: head 2.0 h70h · axial · 0.47mm/px · z∈[-55,+87]mm · 9 of 89 slices shown, 12 images]
[im 9/89  brain]
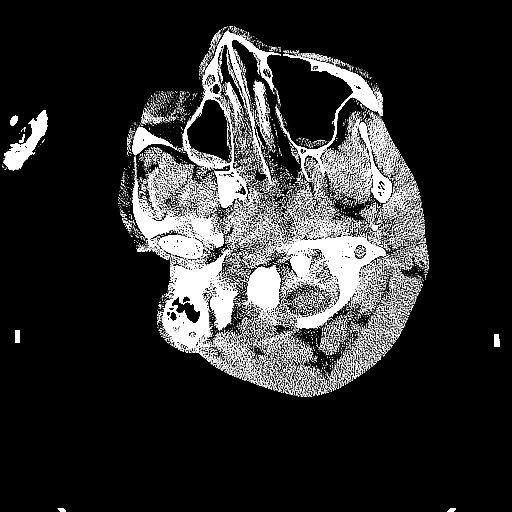
[im 9/89  bone]
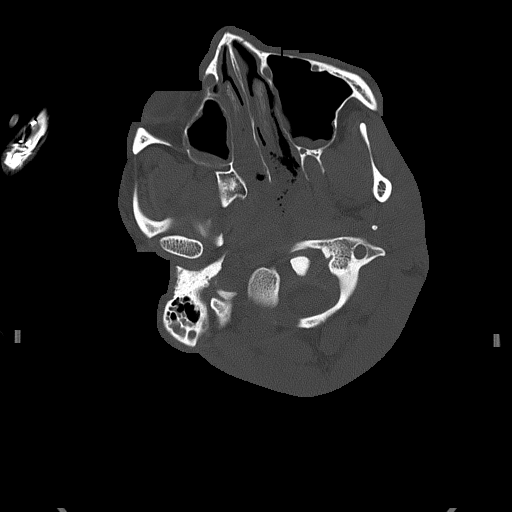
[im 18/89  brain]
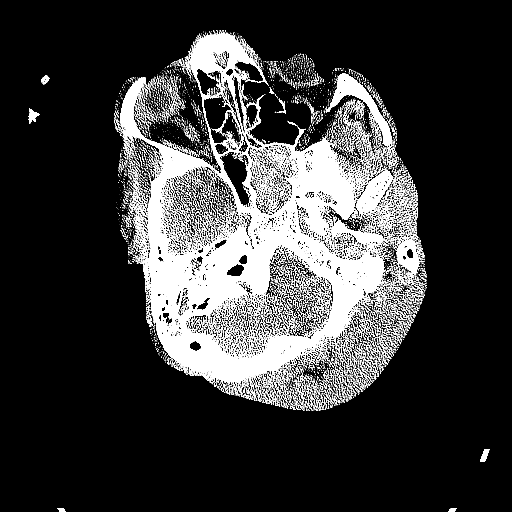
[im 27/89  brain]
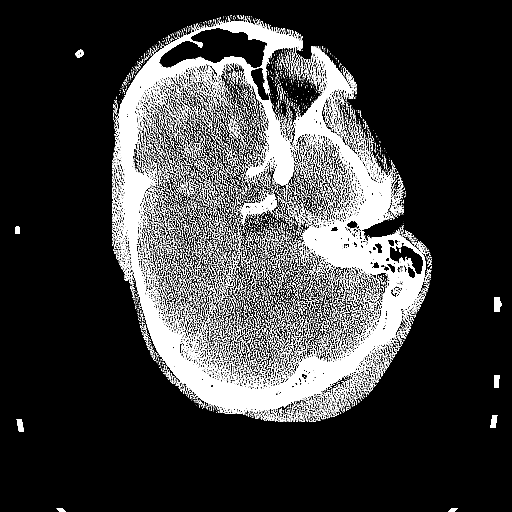
[im 36/89  brain]
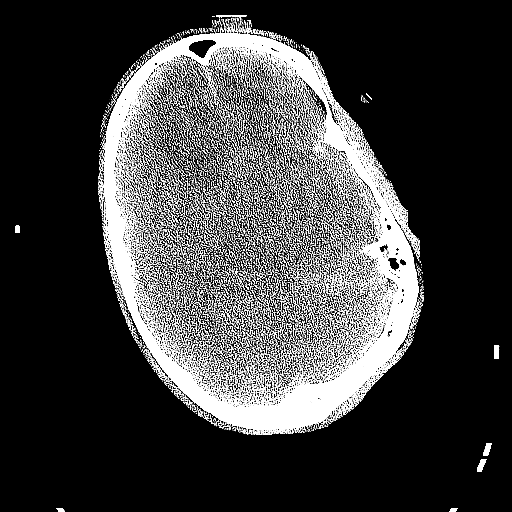
[im 45/89  brain]
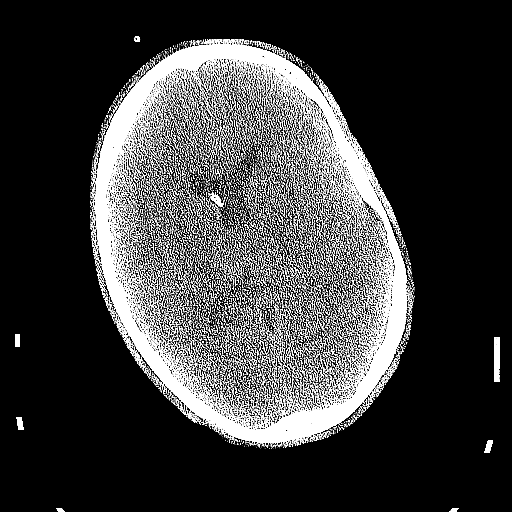
[im 45/89  bone]
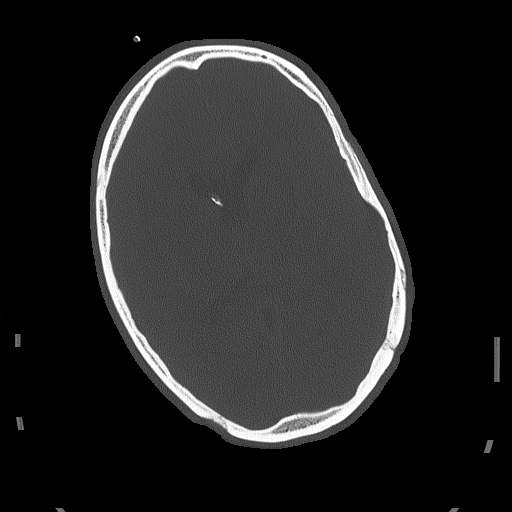
[im 53/89  brain]
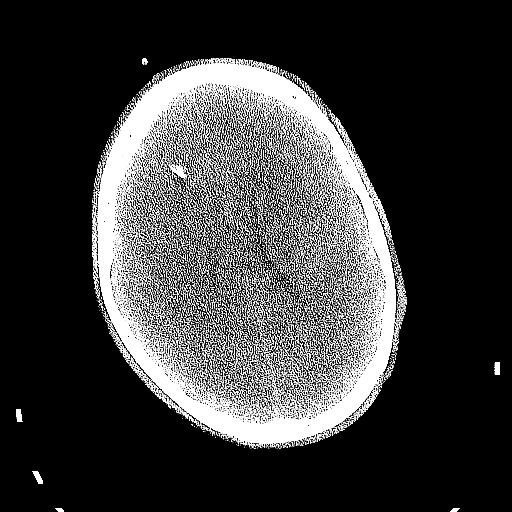
[im 62/89  brain]
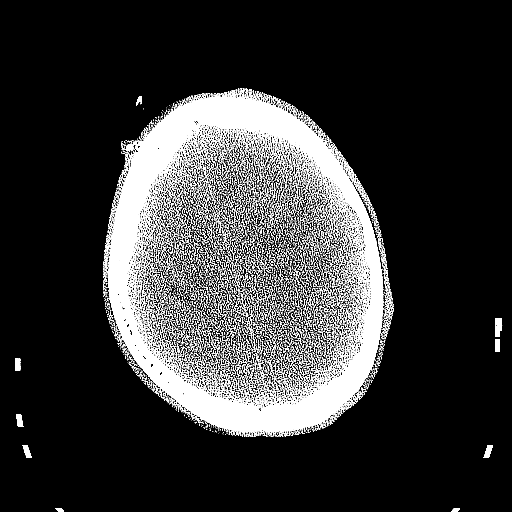
[im 71/89  brain]
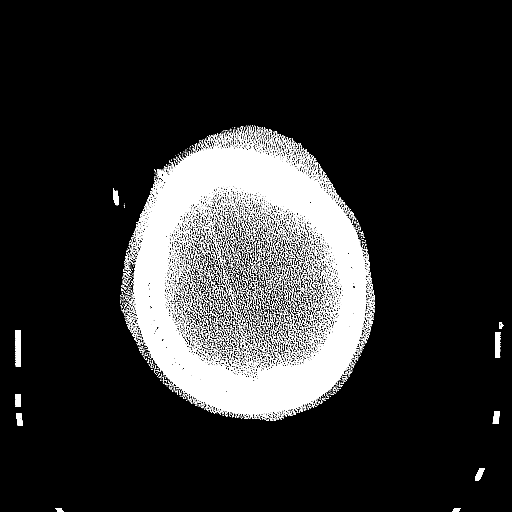
[im 80/89  brain]
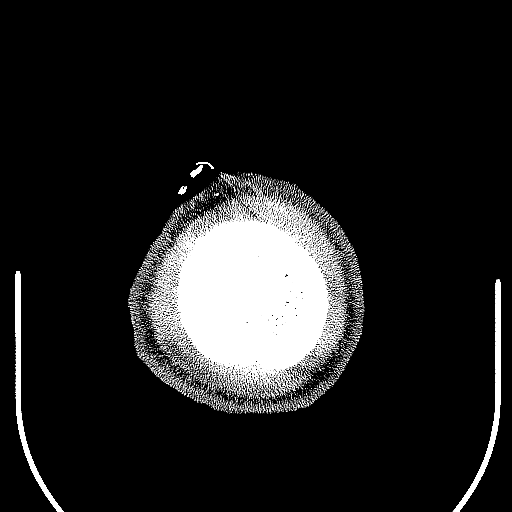
[im 80/89  bone]
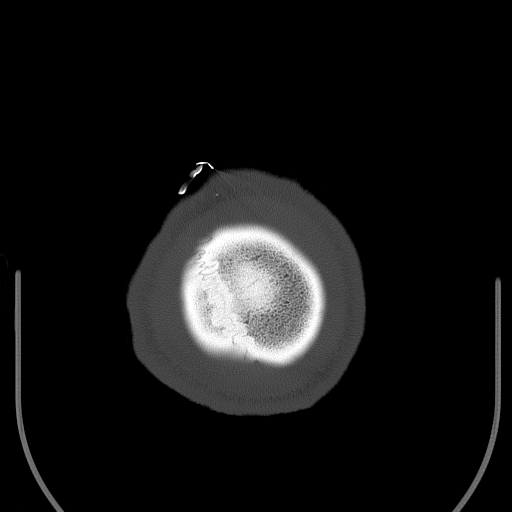

[Series 5: head 3.0 mpr cor · coronal · 0.34mm/px · 3 of 72 slices shown]
[im 24/72  brain]
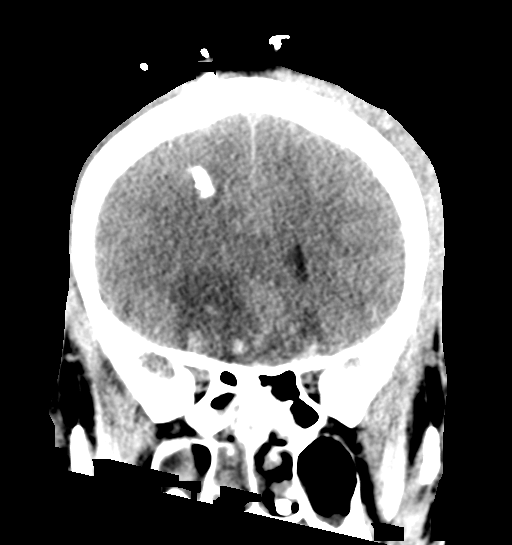
[im 32/72  brain]
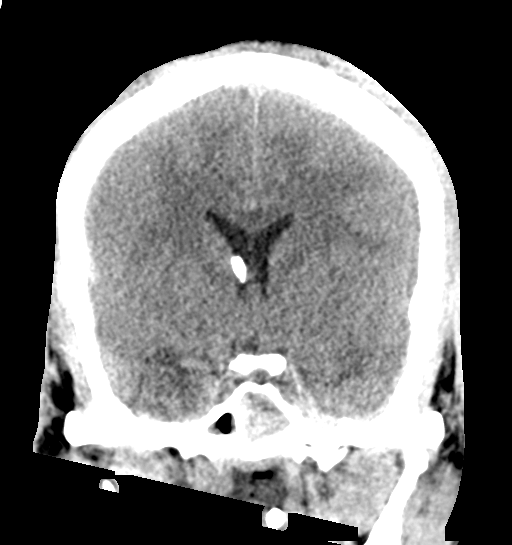
[im 40/72  brain]
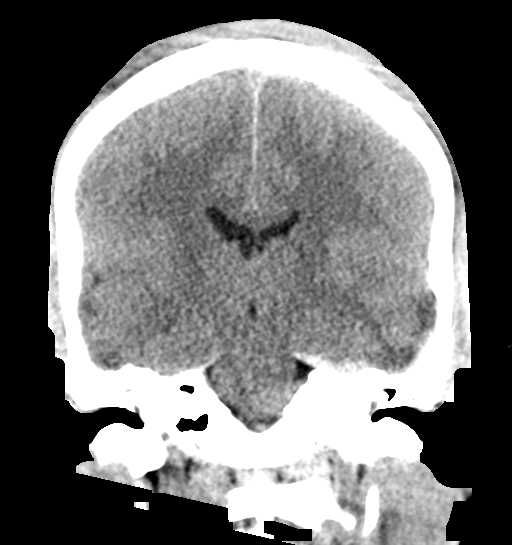

[Series 6: head 3.0 mpr sag · sagittal · 0.34mm/px · 3 of 57 slices shown]
[im 25/57  brain]
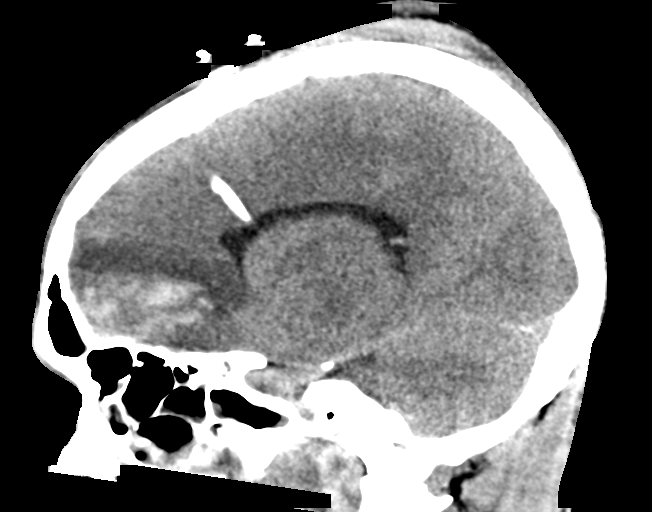
[im 29/57  brain]
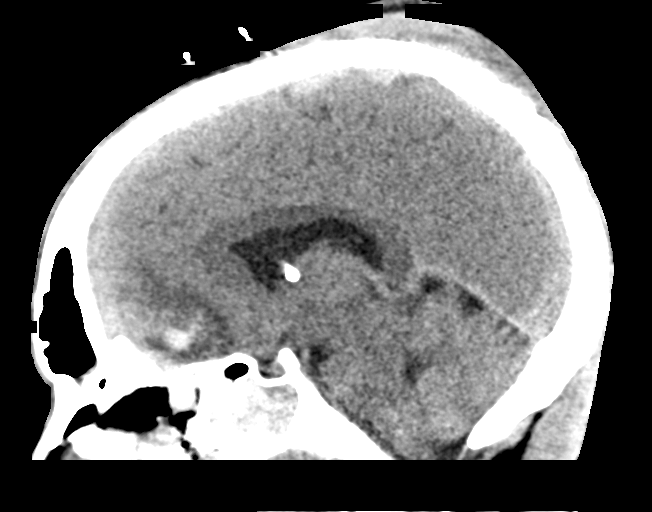
[im 33/57  brain]
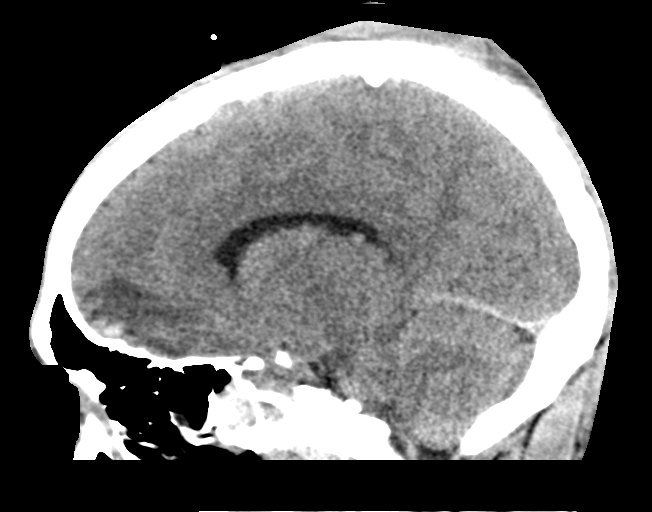

[15 of 47 positions shown; findings below may reference images not displayed]

FINDINGS: Brain: There is now a right frontal approach extraventricular
drainage catheter with tip near the foramina of [place]. Worsening
edema surrounding the hemorrhagic sites of the frontal and temporal
lobes, particularly in the right frontal lobe. The size of the
ventricles is decreased from the prior study. Unchanged subdural
blood along the left tentorium cerebelli.

Vascular: No hyperdense vessel or unexpected calcification.

Skull: Multiple large scalp hematomas. Right frontal burr hole.
Bilateral temporal bone fractures.

Sinuses/Orbits: Opacification of the left sphenoid sinus. Otherwise
multifocal paranasal sinus mucosal thickening. Right mastoid
effusion and small amount of left mastoid fluid. Normal orbits.

Other: None
IMPRESSION: 1. Status post placement of right frontal approach extraventricular
drainage catheter with decreased size of the ventricles.
2. Worsening edema surrounding the hemorrhagic sites of the frontal
and temporal lobes.

## 2021-12-06 ENCOUNTER — Encounter: Payer: Self-pay | Admitting: Neurology

## 2021-12-06 ENCOUNTER — Ambulatory Visit: Payer: 59 | Admitting: Neurology

## 2021-12-06 VITALS — BP 112/69 | HR 67 | Ht 70.0 in | Wt 158.0 lb

## 2021-12-06 DIAGNOSIS — G40209 Localization-related (focal) (partial) symptomatic epilepsy and epileptic syndromes with complex partial seizures, not intractable, without status epilepticus: Secondary | ICD-10-CM | POA: Diagnosis not present

## 2021-12-06 MED ORDER — DIVALPROEX SODIUM 500 MG PO DR TAB: DELAYED_RELEASE_TABLET | ORAL | 11 refills | Status: DC
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
## 2021-12-07 ENCOUNTER — Telehealth: Payer: Self-pay | Admitting: Neurology

## 2021-12-07 NOTE — Telephone Encounter (Signed)
Patient called and stated he needs a letter to return to work, it needs to state his limitations.  He needs it faxed to Delice Bison in [employer] at [phone].

## 2021-12-08 ENCOUNTER — Encounter: Payer: Self-pay | Admitting: Neurology

## 2021-12-11 ENCOUNTER — Telehealth: Payer: Self-pay | Admitting: Neurology

## 2021-12-11 ENCOUNTER — Encounter: Payer: Self-pay | Admitting: Neurology

## 2021-12-11 MED ORDER — DIVALPROEX SODIUM 500 MG PO DR TAB: DELAYED_RELEASE_TABLET | ORAL | 11 refills | Status: AC

## 2021-12-11 NOTE — Telephone Encounter (Signed)
Letter faxed.

## 2021-12-11 NOTE — Telephone Encounter (Signed)
Pt called to take 1/2 tab of Depakote twice a day for a week and update [place].pt also advised that I faxed his letter

## 2021-12-11 NOTE — Telephone Encounter (Signed)
Done, thanks

## 2021-12-11 NOTE — Telephone Encounter (Signed)
Pls have him go down to 1/2 tab twice a day for a week and update [place]. Thanks

## 2021-12-11 NOTE — Addendum Note (Signed)
Addended by: Dimas Chyle on: 12/11/2021 03:11 PM   Modules accepted: Orders

## 2021-12-11 NOTE — Telephone Encounter (Signed)
Patient called and stated he feels like the Depakote is too strong.  He stated he is feeling a little shaky.

## 2021-12-13 ENCOUNTER — Telehealth: Payer: Self-pay

## 2021-12-13 ENCOUNTER — Telehealth: Payer: Self-pay | Admitting: Neurology

## 2021-12-13 NOTE — Telephone Encounter (Signed)
Disability forms arrived by fax for completion, sending back for review.

## 2021-12-13 NOTE — Telephone Encounter (Signed)
Pt called no answer when he calls back we need to find out if his disability paperwork is the 6 months he needs to be out.

## 2021-12-14 NOTE — Telephone Encounter (Signed)
He is needing it for the 6 months that he cant drive,

## 2021-12-19 DIAGNOSIS — Z0279 Encounter for issue of other medical certificate: Secondary | ICD-10-CM

## 2021-12-19 NOTE — Telephone Encounter (Signed)
Documents are at front desk, waiting for pt to pay for filling out forms.

## 2021-12-19 NOTE — Telephone Encounter (Signed)
Patient brought by new forms for completion, sending back for review. Not sure if they are the same.

## 2021-12-20 ENCOUNTER — Encounter: Payer: Self-pay | Admitting: Neurology

## 2021-12-20 NOTE — Telephone Encounter (Signed)
Form done, thanks ?

## 2021-12-27 ENCOUNTER — Ambulatory Visit (HOSPITAL_COMMUNITY)
Admission: RE | Admit: 2021-12-27 | Discharge: 2021-12-27 | Disposition: A | Discharge status: Home / Self Care | Payer: 59 | Source: Ambulatory Visit | Attending: Neurology | Admitting: Neurology

## 2021-12-27 DIAGNOSIS — G40209 Localization-related (focal) (partial) symptomatic epilepsy and epileptic syndromes with complex partial seizures, not intractable, without status epilepticus: Secondary | ICD-10-CM | POA: Diagnosis present

## 2021-12-27 NOTE — Progress Notes (Signed)
EEG completed, results pending. 

## 2021-12-28 NOTE — Procedures (Signed)
ELECTROENCEPHALOGRAM REPORT  Date of Study: 12/27/2021  Patient's Name: Allen [name] MRN: [id number] Date of Birth: [birth year]  Referring Provider: Dr. Patrcia Dolly  Clinical History: This is a 36 year old man with post-traumatic seizures. EEG for classification.  Medications: Depakote  Technical Summary: A multichannel digital EEG recording measured by the international 10-20 system with electrodes applied with paste and impedances below 5000 ohms performed in our laboratory with EKG monitoring in an awake and drowsy patient.  Hyperventilation and photic stimulation were performed.  The digital EEG was referentially recorded, reformatted, and digitally filtered in a variety of bipolar and referential montages for optimal display.    Description: The patient is awake and drowsy during the recording.  During maximal wakefulness, there is a symmetric, medium voltage 10 Hz posterior dominant rhythm that attenuates with eye opening.  The record is symmetric.  During drowsiness, there is an increase in theta slowing of the background.  Sleep was not captured. Hyperventilation and photic stimulation did not elicit any abnormalities.  There were no epileptiform discharges or electrographic seizures seen.    EKG lead was unremarkable.  Impression: This awake and drowsy EEG is normal.    Clinical Correlation: A normal EEG does not exclude a clinical diagnosis of epilepsy.  If further clinical questions remain, prolonged EEG may be helpful.  Clinical correlation is advised.   Patrcia Dolly, M.D.

## 2022-04-09 ENCOUNTER — Ambulatory Visit (INDEPENDENT_AMBULATORY_CARE_PROVIDER_SITE_OTHER): Payer: 59 | Admitting: Neurology

## 2022-04-09 ENCOUNTER — Encounter: Payer: Self-pay | Admitting: Neurology

## 2022-04-09 VITALS — BP 101/69 | HR 70 | Ht 70.0 in | Wt 155.4 lb

## 2022-04-09 DIAGNOSIS — G40209 Localization-related (focal) (partial) symptomatic epilepsy and epileptic syndromes with complex partial seizures, not intractable, without status epilepticus: Secondary | ICD-10-CM
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
## 2022-04-17 ENCOUNTER — Telehealth: Payer: Self-pay | Admitting: Neurology

## 2022-04-17 NOTE — Telephone Encounter (Signed)
Pt's wife called in stating the pt gave Dr. Karel Jarvis FMLA paperwork at his visit on 04/09/22 and they never got it.

## 2022-04-17 NOTE — Telephone Encounter (Signed)
Done, thanks

## 2022-04-19 NOTE — Telephone Encounter (Signed)
Paperwork has been sent off

## 2022-05-05 ENCOUNTER — Other Ambulatory Visit: Payer: 59

## 2022-05-16 ENCOUNTER — Telehealth: Payer: Self-pay | Admitting: *Deleted

## 2022-05-24 ENCOUNTER — Telehealth: Payer: Self-pay | Admitting: *Deleted

## 2022-06-05 ENCOUNTER — Other Ambulatory Visit: Payer: 59

## 2022-06-19 ENCOUNTER — Other Ambulatory Visit: Payer: 59

## 2022-06-19 ENCOUNTER — Encounter: Payer: Self-pay | Admitting: Neurology

## 2022-07-06 ENCOUNTER — Ambulatory Visit (INDEPENDENT_AMBULATORY_CARE_PROVIDER_SITE_OTHER): Payer: 59 | Admitting: Neurology

## 2022-07-06 DIAGNOSIS — G40209 Localization-related (focal) (partial) symptomatic epilepsy and epileptic syndromes with complex partial seizures, not intractable, without status epilepticus: Secondary | ICD-10-CM | POA: Diagnosis not present

## 2022-07-06 NOTE — Progress Notes (Signed)
Ambulatory EEG hooked up and running. Light flashing. Push button tested. Camera and event log explained. Batteries explained. Patient understood.

## 2022-07-09 NOTE — Progress Notes (Deleted)
Ambulatory EEG hooked up and running. Light flashing. Push button tested. Camera and event log explained. Batteries explained. Patient understood.

## 2022-07-09 NOTE — Progress Notes (Signed)
EEG removed. No diary returned. No events reported by patient.

## 2022-08-06 NOTE — Procedures (Signed)
ELECTROENCEPHALOGRAM REPORT  Dates of Recording: 07/06/2022 8:51AM to 07/09/2022 9:55AM  Patient's Name: Allen [name] MRN: [id number] Date of Birth: 1985-11-16  Referring Provider: Dr. Ellouise Newer  Procedure: 71-hour ambulatory video EEG  History:  This is a 37 year old man with seizures. No convulsions since 11/2021 however his wife continues to note staring spells, confusion, forgetfulness. EEG for classification.   Medications: Depakote  Technical Summary: This is a 71-hour multichannel digital video EEG recording measured by the international 10-20 system with electrodes applied with paste and impedances below 5000 ohms performed as portable with EKG monitoring.  The digital EEG was referentially recorded, reformatted, and digitally filtered in a variety of bipolar and referential montages for optimal display.    DESCRIPTION OF RECORDING: During maximal wakefulness, the background activity consisted of a symmetric 10 Hz posterior dominant rhythm which was reactive to eye opening.  There were no epileptiform discharges or focal slowing seen in wakefulness.  During the recording, the patient progresses through wakefulness, drowsiness, and Stage 2 sleep.  Again, there were no epileptiform discharges seen.  Events: Patient did not submit symptom diary. Push button events appear accidental, no video available for review.   There were no electrographic seizures seen.  EKG lead was unremarkable.  IMPRESSION: This 71-hour ambulatory video EEG study is normal.    CLINICAL CORRELATION: A normal EEG does not exclude a clinical diagnosis of epilepsy. Typical events were not reported.  If further clinical questions remain, inpatient video EEG monitoring may be helpful.   Ellouise Newer, M.D.
